# Patient Record
Sex: Male | Born: 1945
Health system: Southern US, Community
[De-identification: ages and names within clinical notes are randomized; demographics above are authoritative.]

## PROBLEM LIST (undated history)

## (undated) DIAGNOSIS — G4733 Obstructive sleep apnea (adult) (pediatric): Secondary | ICD-10-CM

## (undated) DIAGNOSIS — Z860101 Personal history of adenomatous and serrated colon polyps: Secondary | ICD-10-CM

## (undated) DIAGNOSIS — M5126 Other intervertebral disc displacement, lumbar region: Secondary | ICD-10-CM

## (undated) DIAGNOSIS — M1990 Unspecified osteoarthritis, unspecified site: Secondary | ICD-10-CM

## (undated) DIAGNOSIS — Z973 Presence of spectacles and contact lenses: Secondary | ICD-10-CM

## (undated) DIAGNOSIS — Z87448 Personal history of other diseases of urinary system: Secondary | ICD-10-CM

## (undated) DIAGNOSIS — M4302 Spondylolysis, cervical region: Secondary | ICD-10-CM

## (undated) DIAGNOSIS — K759 Inflammatory liver disease, unspecified: Secondary | ICD-10-CM

## (undated) DIAGNOSIS — K219 Gastro-esophageal reflux disease without esophagitis: Secondary | ICD-10-CM

## (undated) DIAGNOSIS — F4310 Post-traumatic stress disorder, unspecified: Secondary | ICD-10-CM

## (undated) DIAGNOSIS — E785 Hyperlipidemia, unspecified: Secondary | ICD-10-CM

## (undated) DIAGNOSIS — R05 Cough: Secondary | ICD-10-CM

## (undated) DIAGNOSIS — N183 Chronic kidney disease, stage 3 unspecified: Secondary | ICD-10-CM

## (undated) DIAGNOSIS — Z87898 Personal history of other specified conditions: Secondary | ICD-10-CM

## (undated) DIAGNOSIS — Z8619 Personal history of other infectious and parasitic diseases: Secondary | ICD-10-CM

## (undated) DIAGNOSIS — F419 Anxiety disorder, unspecified: Secondary | ICD-10-CM

## (undated) DIAGNOSIS — G4730 Sleep apnea, unspecified: Secondary | ICD-10-CM

## (undated) DIAGNOSIS — T83490A Other mechanical complication of penile (implanted) prosthesis, initial encounter: Secondary | ICD-10-CM

## (undated) DIAGNOSIS — N400 Enlarged prostate without lower urinary tract symptoms: Secondary | ICD-10-CM

## (undated) DIAGNOSIS — N529 Male erectile dysfunction, unspecified: Secondary | ICD-10-CM

## (undated) DIAGNOSIS — I10 Essential (primary) hypertension: Secondary | ICD-10-CM

## (undated) DIAGNOSIS — Z86010 Personal history of colonic polyps: Secondary | ICD-10-CM

## (undated) DIAGNOSIS — Z794 Long term (current) use of insulin: Secondary | ICD-10-CM

## (undated) DIAGNOSIS — G629 Polyneuropathy, unspecified: Secondary | ICD-10-CM

## (undated) DIAGNOSIS — R059 Cough, unspecified: Secondary | ICD-10-CM

## (undated) DIAGNOSIS — C801 Malignant (primary) neoplasm, unspecified: Secondary | ICD-10-CM

## (undated) DIAGNOSIS — J189 Pneumonia, unspecified organism: Secondary | ICD-10-CM

## (undated) DIAGNOSIS — T884XXA Failed or difficult intubation, initial encounter: Secondary | ICD-10-CM

## (undated) DIAGNOSIS — H269 Unspecified cataract: Secondary | ICD-10-CM

## (undated) DIAGNOSIS — E119 Type 2 diabetes mellitus without complications: Secondary | ICD-10-CM

## (undated) DIAGNOSIS — Z9989 Dependence on other enabling machines and devices: Secondary | ICD-10-CM

## (undated) HISTORY — DX: Personal history of other diseases of urinary system: Z87.448

## (undated) HISTORY — DX: Unspecified cataract: H26.9

## (undated) HISTORY — PX: HERNIA REPAIR: SHX51

## (undated) HISTORY — DX: Hyperlipidemia, unspecified: E78.5

## (undated) HISTORY — PX: COLONOSCOPY: SHX174

## (undated) HISTORY — PX: OTHER SURGICAL HISTORY: SHX169

---

## 1996-08-27 HISTORY — PX: PENILE PROSTHESIS PLACEMENT: SHX739

## 1998-01-31 ENCOUNTER — Ambulatory Visit (HOSPITAL_COMMUNITY): Admission: RE | Admit: 1998-01-31 | Discharge: 1998-01-31 | Payer: Self-pay | Admitting: Family Medicine

## 1998-04-25 ENCOUNTER — Ambulatory Visit (HOSPITAL_COMMUNITY): Admission: RE | Admit: 1998-04-25 | Discharge: 1998-04-25 | Payer: Self-pay | Admitting: Neurosurgery

## 1998-06-02 ENCOUNTER — Encounter: Payer: Self-pay | Admitting: Neurosurgery

## 1998-06-02 ENCOUNTER — Observation Stay (HOSPITAL_COMMUNITY): Admission: RE | Admit: 1998-06-02 | Discharge: 1998-06-02 | Payer: Self-pay | Admitting: Neurosurgery

## 1998-06-30 ENCOUNTER — Inpatient Hospital Stay (HOSPITAL_COMMUNITY): Admission: AD | Admit: 1998-06-30 | Discharge: 1998-07-04 | Payer: Self-pay | Admitting: Family Medicine

## 1998-07-19 ENCOUNTER — Encounter: Admission: RE | Admit: 1998-07-19 | Discharge: 1998-10-17 | Payer: Self-pay | Admitting: Family Medicine

## 2001-01-01 ENCOUNTER — Encounter: Admission: RE | Admit: 2001-01-01 | Discharge: 2001-04-01 | Payer: Self-pay | Admitting: Internal Medicine

## 2001-10-29 ENCOUNTER — Encounter: Admission: RE | Admit: 2001-10-29 | Discharge: 2001-10-29 | Payer: Self-pay

## 2002-04-01 ENCOUNTER — Encounter: Admission: RE | Admit: 2002-04-01 | Discharge: 2002-04-01 | Payer: Self-pay | Admitting: Internal Medicine

## 2002-04-08 ENCOUNTER — Encounter: Admission: RE | Admit: 2002-04-08 | Discharge: 2002-04-08 | Payer: Self-pay | Admitting: Internal Medicine

## 2002-04-14 ENCOUNTER — Encounter: Admission: RE | Admit: 2002-04-14 | Discharge: 2002-04-14 | Payer: Self-pay | Admitting: Internal Medicine

## 2003-12-07 ENCOUNTER — Emergency Department (HOSPITAL_COMMUNITY): Admission: EM | Admit: 2003-12-07 | Discharge: 2003-12-07 | Payer: Self-pay | Admitting: Advanced Practice Midwife

## 2004-03-27 HISTORY — PX: CARPAL TUNNEL RELEASE: SHX101

## 2013-08-27 DIAGNOSIS — J189 Pneumonia, unspecified organism: Secondary | ICD-10-CM

## 2013-08-27 DIAGNOSIS — T884XXA Failed or difficult intubation, initial encounter: Secondary | ICD-10-CM

## 2013-08-27 HISTORY — DX: Pneumonia, unspecified organism: J18.9

## 2013-08-27 HISTORY — DX: Failed or difficult intubation, initial encounter: T88.4XXA

## 2014-02-09 ENCOUNTER — Other Ambulatory Visit: Payer: Self-pay | Admitting: Neurosurgery

## 2014-02-23 NOTE — Pre-Procedure Instructions (Signed)
Randy [name]  02/23/2014   Your procedure is scheduled on:  July 9th, Thursday   Report to [facility] Admitting at  5:30 AM.   Call this number if you have problems the morning of surgery: [phone]   Remember:   Do not eat food or drink liquids after midnight Wednesday.    Take these medicines the morning of surgery with A SIP OF WATER:    Do not wear jewelry - no rings or watches.  Do not wear any lotions or colognes.  You may NOT wear deodorant.   Men may shave face and neck.   Do not bring valuables to the hospital.  [facility] is not responsible for any belongings or valuables.               Contacts, dentures or bridgework may not be worn into surgery.  Leave suitcase in the car. After surgery it may be brought to your room.  For patients admitted to the hospital, discharge time is determined by your treatment team.               Name and phone number of your driver:    Special Instructions: "Preparing for Surgery" instruction sheet.   Please read over the following fact sheets that you were given: Pain Booklet and Surgical Site Infection Prevention

## 2014-02-24 ENCOUNTER — Encounter (HOSPITAL_COMMUNITY)
Admission: RE | Admit: 2014-02-24 | Discharge: 2014-02-24 | Disposition: A | Discharge status: Home / Self Care | Payer: BC Managed Care – PPO | Source: Ambulatory Visit | Attending: Neurosurgery | Admitting: Neurosurgery

## 2014-02-24 ENCOUNTER — Encounter (HOSPITAL_COMMUNITY): Payer: Self-pay

## 2014-02-24 DIAGNOSIS — Z01818 Encounter for other preprocedural examination: Secondary | ICD-10-CM | POA: Insufficient documentation

## 2014-02-24 DIAGNOSIS — Z01810 Encounter for preprocedural cardiovascular examination: Secondary | ICD-10-CM | POA: Insufficient documentation

## 2014-02-24 DIAGNOSIS — Z01812 Encounter for preprocedural laboratory examination: Secondary | ICD-10-CM | POA: Insufficient documentation

## 2014-02-24 HISTORY — DX: Sleep apnea, unspecified: G47.30

## 2014-02-24 HISTORY — DX: Anxiety disorder, unspecified: F41.9

## 2014-02-24 HISTORY — DX: Inflammatory liver disease, unspecified: K75.9

## 2014-02-24 HISTORY — DX: Gastro-esophageal reflux disease without esophagitis: K21.9

## 2014-02-24 LAB — COMPREHENSIVE METABOLIC PANEL
ALT: 12 U/L (ref 0–53)
AST: 15 U/L (ref 0–37)
Albumin: 3.9 g/dL (ref 3.5–5.2)
Alkaline Phosphatase: 77 U/L (ref 39–117)
Anion gap: 15 (ref 5–15)
BUN: 21 mg/dL (ref 6–23)
CALCIUM: 9.6 mg/dL (ref 8.4–10.5)
CO2: 26 mEq/L (ref 19–32)
CREATININE: 2.17 mg/dL — AB (ref 0.50–1.35)
Chloride: 98 mEq/L (ref 96–112)
GFR, EST AFRICAN AMERICAN: 34 mL/min — AB (ref >90)
GFR, EST NON AFRICAN AMERICAN: 30 mL/min — AB (ref >90)
GLUCOSE: 228 mg/dL — AB (ref 70–99)
Potassium: 3.9 mEq/L (ref 3.7–5.3)
Sodium: 139 mEq/L (ref 137–147)
Total Bilirubin: 0.5 mg/dL (ref 0.3–1.2)
Total Protein: 7.7 g/dL (ref 6.0–8.3)

## 2014-02-24 LAB — SURGICAL PCR SCREEN
MRSA, PCR: NEGATIVE
Staphylococcus aureus: NEGATIVE

## 2014-02-24 LAB — CBC
HCT: 42 % (ref 39.0–52.0)
HEMOGLOBIN: 14.4 g/dL (ref 13.0–17.0)
MCH: 28.8 pg (ref 26.0–34.0)
MCHC: 34.3 g/dL (ref 30.0–36.0)
MCV: 84 fL (ref 78.0–100.0)
Platelets: 175 10*3/uL (ref 150–400)
RBC: 5 MIL/uL (ref 4.22–5.81)
RDW: 14.6 % (ref 11.5–15.5)
WBC: 12.1 10*3/uL — ABNORMAL HIGH (ref 4.0–10.5)

## 2014-02-24 NOTE — Progress Notes (Addendum)
Anesthesia Chart Review:  Patient is a 68 year old male scheduled for C3-4, C4-5 ACDF on 03/04/14 by Dr. Arnoldo Morale.  History includes smoking, GERD, DM2, anxiety/PTSD, OSA with CPAP use, hepatitis (unsure type, thinks it was "food borne") 68, c-spine surgery X 2, penile prothesis. He did not report a history of CKD, but Cr was elevated at 2.17 at PAT and previously was 1.7 at the [facility] on 01/06/14. BMI is consistent with obesity (31.8). PCP is Lucille Passy, PA-C with the [place] in [place]. BP was 83/58 at PAT. He reported that his SBP at his urologist earlier that day was in the 100 range. I was going to evaluate him during his PAT visit, but he left before I saw him.    Medications: Liquid tears, allopurinol, Lipitor, HCTZ, ibuprofen, Novolin 70/30, Novolin R, lisinopril, metoprolol, terazosin.  EKG on 02/24/14 showed NSR, LAD.  CXR on 02/24/14 showed: No active cardiopulmonary disease.   According to [facility] notes, MRI of the cspine on 02/08/14 showed: Bulging discs at C3-4 and C4-5 with moderate to severe central canal stenosis and cord compression at C3-4.  There is reported increased cord signal at C3 and C4, but this is not obvious on personal review in clinic 02/17/14.  Bilateral foraminal narrowing at all cervical levels from uncovertable spurs and facet arthropathy. Prior fusion at C7-T1.   Preoperative labs noted. Cr 2.17.  Non-fasting glucose 228.  AST/ALT WNL. WBC 12.1.  H/H 14.4/42.0. A1C was 7.3 at the [facility] on 01/06/14.   I am requesting his last PCP records from the [facility] and will follow-up once received.  George Hugh [facility]Anesthesiology Phone [phone] 02/25/2014  Addendum: 03/01/2014 2:36 PM Received additional records from [facility].  Cr trends are 1.4 - 1.7 since 03/07/06--predominantly in the 1.7 range, which was his last result there are 01/06/14.  They still did not send his most recent PCP note.  His BP at his last neurology visit on 02/17/14 was 106/69. I left  a voice message for patient to contact me earlier. He did call me back. He has been living in the [place] for the past 6 years, but returned on 01/05/14 for further evaluation since he began having BLE/BLL numbness 6-8 months ago. He is planning to move back once he recovers from surgery. Of note, he was seen last week at [facility] due to his penile prosthesis not working.  He may consider replacement as well following c-spine surgery recovery.  He says he has been on ACEI therapy for years now for his kidneys, but otherwise didn't know much specifics regarding CKD. He denies recent illness and is not currently taking any NSAIDS. He has been receiving most of his other care through the [facility].  He did have to see his son's PCP Dr. Monico Blitz in [place] just to get an out of [facility] referral to neurosurgery. (His second neck surgery was done at a [facility].  He had RUE difficultly following that surgery, so wanted to go back to [facility] in [place] for this surgery.) He checks his BP at home nearly every day, with average being 100-115/70's.  He denies any chest pain, SOB, syncope.  No known history of CAD/CHF/MI. His activity has been more limited for the past 6-8 months due to his rather diffuse paresthesias, but prior to that was doing quite a bit of walking.  He could walk at least a mile with no difficulty.  He denies having to use stairs.  I reviewed above with anesthesiologist Dr. Albertina Parr.  Based on previous labs, patient has a degree of underlying CKD but now with a further increase in his Cr since 12/2013. Other than ACEI therapy, it's unclear how (or who) his CKD is being managed.  If Dr. Arnoldo Morale feels surgery could safely be delayed, anesthesia would like for his PCP (or a nephrologist) to evaluate/make preoperative recommendations prior to surgery.  If Dr. Arnoldo Morale felt surgery was urgent and should not be delayed then I will plan to order an ISTAT8 on arrival to re-evaluate his Cr for  stability.  Based on results, Dr. Arnoldo Morale may need a medicine or nephrology consult post-operatively.  I have notified Manuela Schwartz at Dr. Adline Mango office.  She will discuss with Dr. Arnoldo Morale and let me know.   Addendum: 03/02/2014 9:26 AM Manuela Schwartz spoke with Dr. Arnoldo Morale about patient's renal function and anesthesia recommendations.  Due to patient's myelopathy, Dr. Arnoldo Morale feels procedure is urgent and thus feels it should proceed as planned.  He will consult nephrology post-operatively. ISTAT 8 ordered to ensure no significant increase in his Cr prior to surgery.

## 2014-02-24 NOTE — Progress Notes (Addendum)
Patient states his blood pressure swings from high to low --today's 83/58......Marland Kitchenhe stated he was just at Urologist and his bp was up in the 100/ range.  Have requested LOV from [place] in [place].  He sees Dr. Illene Silver the "white" team  (pt thinks) Also requested Sleep study results from [facility] (was in 2003, their fax [phone]) Uses CPAP.

## 2014-03-01 ENCOUNTER — Encounter (HOSPITAL_COMMUNITY): Payer: Self-pay

## 2014-03-03 MED ORDER — CEFAZOLIN SODIUM-DEXTROSE 2-3 GM-% IV SOLR
2.0000 g | INTRAVENOUS | Status: AC
  Administered 2014-03-04: 2 g via INTRAVENOUS
  Filled 2014-03-03: qty 50

## 2014-03-04 ENCOUNTER — Encounter (HOSPITAL_COMMUNITY)
Admission: RE | Disposition: A | Discharge status: Home / Self Care | Payer: Self-pay | Source: Ambulatory Visit | Attending: Neurosurgery

## 2014-03-04 ENCOUNTER — Ambulatory Visit (HOSPITAL_COMMUNITY)
Admission: RE | Admit: 2014-03-04 | Discharge: 2014-03-05 | Disposition: A | Discharge status: Home / Self Care | Payer: BC Managed Care – PPO | Source: Ambulatory Visit | Attending: Neurosurgery | Admitting: Neurosurgery

## 2014-03-04 ENCOUNTER — Encounter (HOSPITAL_COMMUNITY): Payer: BC Managed Care – PPO | Admitting: Vascular Surgery

## 2014-03-04 ENCOUNTER — Ambulatory Visit (HOSPITAL_COMMUNITY): Payer: BC Managed Care – PPO | Admitting: Certified Registered Nurse Anesthetist

## 2014-03-04 ENCOUNTER — Ambulatory Visit (HOSPITAL_COMMUNITY): Payer: BC Managed Care – PPO

## 2014-03-04 ENCOUNTER — Encounter (HOSPITAL_COMMUNITY): Payer: Self-pay | Admitting: *Deleted

## 2014-03-04 DIAGNOSIS — M5000 Cervical disc disorder with myelopathy, unspecified cervical region: Secondary | ICD-10-CM | POA: Insufficient documentation

## 2014-03-04 DIAGNOSIS — N400 Enlarged prostate without lower urinary tract symptoms: Secondary | ICD-10-CM | POA: Insufficient documentation

## 2014-03-04 DIAGNOSIS — Z794 Long term (current) use of insulin: Secondary | ICD-10-CM | POA: Insufficient documentation

## 2014-03-04 DIAGNOSIS — F411 Generalized anxiety disorder: Secondary | ICD-10-CM | POA: Insufficient documentation

## 2014-03-04 DIAGNOSIS — N183 Chronic kidney disease, stage 3 unspecified: Secondary | ICD-10-CM | POA: Insufficient documentation

## 2014-03-04 DIAGNOSIS — K219 Gastro-esophageal reflux disease without esophagitis: Secondary | ICD-10-CM | POA: Insufficient documentation

## 2014-03-04 DIAGNOSIS — N179 Acute kidney failure, unspecified: Secondary | ICD-10-CM | POA: Insufficient documentation

## 2014-03-04 DIAGNOSIS — E119 Type 2 diabetes mellitus without complications: Secondary | ICD-10-CM | POA: Insufficient documentation

## 2014-03-04 DIAGNOSIS — G4730 Sleep apnea, unspecified: Secondary | ICD-10-CM | POA: Insufficient documentation

## 2014-03-04 DIAGNOSIS — M4712 Other spondylosis with myelopathy, cervical region: Secondary | ICD-10-CM | POA: Insufficient documentation

## 2014-03-04 DIAGNOSIS — F4310 Post-traumatic stress disorder, unspecified: Secondary | ICD-10-CM | POA: Insufficient documentation

## 2014-03-04 DIAGNOSIS — I129 Hypertensive chronic kidney disease with stage 1 through stage 4 chronic kidney disease, or unspecified chronic kidney disease: Secondary | ICD-10-CM | POA: Insufficient documentation

## 2014-03-04 DIAGNOSIS — F17200 Nicotine dependence, unspecified, uncomplicated: Secondary | ICD-10-CM | POA: Insufficient documentation

## 2014-03-04 HISTORY — PX: ANTERIOR CERVICAL DECOMP/DISCECTOMY FUSION: SHX1161

## 2014-03-04 LAB — POCT I-STAT, CHEM 8
BUN: 17 mg/dL (ref 6–23)
Calcium, Ion: 1.33 mmol/L — ABNORMAL HIGH (ref 1.13–1.30)
Chloride: 100 mEq/L (ref 96–112)
Creatinine, Ser: 2 mg/dL — ABNORMAL HIGH (ref 0.50–1.35)
Glucose, Bld: 67 mg/dL — ABNORMAL LOW (ref 70–99)
HEMATOCRIT: 46 % (ref 39.0–52.0)
Hemoglobin: 15.6 g/dL (ref 13.0–17.0)
POTASSIUM: 3.6 meq/L — AB (ref 3.7–5.3)
Sodium: 144 mEq/L (ref 137–147)
TCO2: 25 mmol/L (ref 0–100)

## 2014-03-04 LAB — GLUCOSE, CAPILLARY
GLUCOSE-CAPILLARY: 285 mg/dL — AB (ref 70–99)
Glucose-Capillary: 144 mg/dL — ABNORMAL HIGH (ref 70–99)
Glucose-Capillary: 93 mg/dL (ref 70–99)
Glucose-Capillary: 105 mg/dL — ABNORMAL HIGH (ref 70–99)
Glucose-Capillary: 119 mg/dL — ABNORMAL HIGH (ref 70–99)
Glucose-Capillary: 212 mg/dL — ABNORMAL HIGH (ref 70–99)
Glucose-Capillary: 220 mg/dL — ABNORMAL HIGH (ref 70–99)

## 2014-03-04 SURGERY — ANTERIOR CERVICAL DECOMPRESSION/DISCECTOMY FUSION 2 LEVELS
Anesthesia: General | Site: Neck

## 2014-03-04 MED ORDER — ALLOPURINOL 100 MG PO TABS
100.0000 mg | ORAL_TABLET | Freq: Every evening | ORAL | Status: DC
  Administered 2014-03-04: 100 mg via ORAL
  Filled 2014-03-04 (×2): qty 1

## 2014-03-04 MED ORDER — ONDANSETRON HCL 4 MG/2ML IJ SOLN
INTRAMUSCULAR | Status: AC
  Filled 2014-03-04: qty 2

## 2014-03-04 MED ORDER — DEXAMETHASONE SODIUM PHOSPHATE 10 MG/ML IJ SOLN
INTRAMUSCULAR | Status: DC | PRN
  Administered 2014-03-04: 10 mg via INTRAVENOUS

## 2014-03-04 MED ORDER — PROPOFOL 10 MG/ML IV BOLUS
INTRAVENOUS | Status: AC
  Filled 2014-03-04: qty 20

## 2014-03-04 MED ORDER — FENTANYL CITRATE 0.05 MG/ML IJ SOLN
INTRAMUSCULAR | Status: AC
  Filled 2014-03-04: qty 5

## 2014-03-04 MED ORDER — ARTIFICIAL TEARS OP OINT
TOPICAL_OINTMENT | OPHTHALMIC | Status: DC | PRN
  Administered 2014-03-04: 1 via OPHTHALMIC

## 2014-03-04 MED ORDER — FENTANYL CITRATE 0.05 MG/ML IJ SOLN
INTRAMUSCULAR | Status: DC | PRN
  Administered 2014-03-04: 50 ug via INTRAVENOUS
  Administered 2014-03-04: 100 ug via INTRAVENOUS
  Administered 2014-03-04 (×2): 50 ug via INTRAVENOUS

## 2014-03-04 MED ORDER — BUPIVACAINE-EPINEPHRINE (PF) 0.5% -1:200000 IJ SOLN
INTRAMUSCULAR | Status: DC | PRN
  Administered 2014-03-04: 10 mL via PERINEURAL

## 2014-03-04 MED ORDER — DOCUSATE SODIUM 100 MG PO CAPS
100.0000 mg | ORAL_CAPSULE | Freq: Two times a day (BID) | ORAL | Status: DC
  Administered 2014-03-04: 100 mg via ORAL
  Filled 2014-03-04: qty 1

## 2014-03-04 MED ORDER — PROPOFOL 10 MG/ML IV EMUL
INTRAVENOUS | Status: AC
  Filled 2014-03-04: qty 50

## 2014-03-04 MED ORDER — MIDAZOLAM HCL 2 MG/2ML IJ SOLN
INTRAMUSCULAR | Status: AC
  Filled 2014-03-04: qty 2

## 2014-03-04 MED ORDER — DIAZEPAM 5 MG PO TABS: 5.0000 mg | ORAL_TABLET | Freq: Four times a day (QID) | ORAL | Status: DC | PRN

## 2014-03-04 MED ORDER — LISINOPRIL 40 MG PO TABS
40.0000 mg | ORAL_TABLET | Freq: Every day | ORAL | Status: DC
  Filled 2014-03-04: qty 1

## 2014-03-04 MED ORDER — HYDROCODONE-ACETAMINOPHEN 5-325 MG PO TABS
1.0000 | ORAL_TABLET | ORAL | Status: DC | PRN
  Administered 2014-03-04 – 2014-03-05 (×3): 2 via ORAL
  Filled 2014-03-04 (×3): qty 2

## 2014-03-04 MED ORDER — THROMBIN 5000 UNITS EX SOLR
CUTANEOUS | Status: DC | PRN
  Administered 2014-03-04 (×2): 5000 [IU] via TOPICAL

## 2014-03-04 MED ORDER — ACETAMINOPHEN 650 MG RE SUPP: 650.0000 mg | RECTAL | Status: DC | PRN

## 2014-03-04 MED ORDER — ALBUTEROL SULFATE (2.5 MG/3ML) 0.083% IN NEBU
INHALATION_SOLUTION | RESPIRATORY_TRACT | Status: AC
  Filled 2014-03-04: qty 3

## 2014-03-04 MED ORDER — LACTATED RINGERS IV SOLN
INTRAVENOUS | Status: DC | PRN
  Administered 2014-03-04 (×3): via INTRAVENOUS

## 2014-03-04 MED ORDER — PHENOL 1.4 % MT LIQD: 1.0000 | OROMUCOSAL | Status: DC | PRN

## 2014-03-04 MED ORDER — PROPOFOL 10 MG/ML IV BOLUS
INTRAVENOUS | Status: DC | PRN
  Administered 2014-03-04: 50 mg via INTRAVENOUS
  Administered 2014-03-04: 150 mg via INTRAVENOUS
  Administered 2014-03-04: 50 mg via INTRAVENOUS

## 2014-03-04 MED ORDER — 0.9 % SODIUM CHLORIDE (POUR BTL) OPTIME
TOPICAL | Status: DC | PRN
  Administered 2014-03-04: 1000 mL

## 2014-03-04 MED ORDER — TERAZOSIN HCL 5 MG PO CAPS
5.0000 mg | ORAL_CAPSULE | Freq: Every day | ORAL | Status: DC
  Administered 2014-03-04: 5 mg via ORAL
  Filled 2014-03-04 (×2): qty 1

## 2014-03-04 MED ORDER — NEOSTIGMINE METHYLSULFATE 10 MG/10ML IV SOLN
INTRAVENOUS | Status: DC | PRN
  Administered 2014-03-04: 4 mg via INTRAVENOUS
  Administered 2014-03-04: 1 mg via INTRAVENOUS

## 2014-03-04 MED ORDER — ACETAMINOPHEN 325 MG PO TABS: 650.0000 mg | ORAL_TABLET | ORAL | Status: DC | PRN

## 2014-03-04 MED ORDER — GLYCOPYRROLATE 0.2 MG/ML IJ SOLN
INTRAMUSCULAR | Status: DC | PRN
  Administered 2014-03-04: 0.6 mg via INTRAVENOUS
  Administered 2014-03-04: 0.2 mg via INTRAVENOUS

## 2014-03-04 MED ORDER — SUCCINYLCHOLINE CHLORIDE 20 MG/ML IJ SOLN
INTRAMUSCULAR | Status: DC | PRN
  Administered 2014-03-04: 120 mg via INTRAVENOUS

## 2014-03-04 MED ORDER — METOPROLOL TARTRATE 50 MG PO TABS
50.0000 mg | ORAL_TABLET | Freq: Two times a day (BID) | ORAL | Status: DC
  Administered 2014-03-04: 50 mg via ORAL
  Filled 2014-03-04 (×3): qty 1

## 2014-03-04 MED ORDER — ARTIFICIAL TEARS OP OINT
TOPICAL_OINTMENT | OPHTHALMIC | Status: AC
  Filled 2014-03-04: qty 3.5

## 2014-03-04 MED ORDER — LACTATED RINGERS IV SOLN
INTRAVENOUS | Status: DC
  Administered 2014-03-04 (×2): via INTRAVENOUS

## 2014-03-04 MED ORDER — ATORVASTATIN CALCIUM 20 MG PO TABS
20.0000 mg | ORAL_TABLET | Freq: Every day | ORAL | Status: DC
  Administered 2014-03-04: 20 mg via ORAL
  Filled 2014-03-04 (×2): qty 1

## 2014-03-04 MED ORDER — LIDOCAINE HCL (CARDIAC) 20 MG/ML IV SOLN
INTRAVENOUS | Status: DC | PRN
  Administered 2014-03-04: 50 mg via INTRAVENOUS

## 2014-03-04 MED ORDER — SODIUM CHLORIDE 0.9 % IR SOLN
Status: DC | PRN
  Administered 2014-03-04: 09:00:00

## 2014-03-04 MED ORDER — ALUM & MAG HYDROXIDE-SIMETH 200-200-20 MG/5ML PO SUSP: 30.0000 mL | Freq: Four times a day (QID) | ORAL | Status: DC | PRN

## 2014-03-04 MED ORDER — PHENYLEPHRINE HCL 10 MG/ML IJ SOLN
INTRAMUSCULAR | Status: DC | PRN
  Administered 2014-03-04 (×5): 80 ug via INTRAVENOUS

## 2014-03-04 MED ORDER — HEMOSTATIC AGENTS (NO CHARGE) OPTIME
TOPICAL | Status: DC | PRN
  Administered 2014-03-04: 1 via TOPICAL

## 2014-03-04 MED ORDER — LACTATED RINGERS IV SOLN: INTRAVENOUS | Status: DC

## 2014-03-04 MED ORDER — OXYCODONE-ACETAMINOPHEN 5-325 MG PO TABS: 1.0000 | ORAL_TABLET | ORAL | Status: DC | PRN

## 2014-03-04 MED ORDER — SUCCINYLCHOLINE CHLORIDE 20 MG/ML IJ SOLN
INTRAMUSCULAR | Status: AC
  Filled 2014-03-04: qty 1

## 2014-03-04 MED ORDER — HYDROMORPHONE HCL PF 1 MG/ML IJ SOLN
INTRAMUSCULAR | Status: AC
  Filled 2014-03-04: qty 1

## 2014-03-04 MED ORDER — MORPHINE SULFATE 2 MG/ML IJ SOLN
1.0000 mg | INTRAMUSCULAR | Status: DC | PRN
  Administered 2014-03-04 (×2): 4 mg via INTRAVENOUS
  Administered 2014-03-05: 2 mg via INTRAVENOUS
  Filled 2014-03-04 (×3): qty 2

## 2014-03-04 MED ORDER — CEFAZOLIN SODIUM-DEXTROSE 2-3 GM-% IV SOLR
2.0000 g | Freq: Three times a day (TID) | INTRAVENOUS | Status: AC
  Administered 2014-03-04 (×2): 2 g via INTRAVENOUS
  Filled 2014-03-04 (×2): qty 50

## 2014-03-04 MED ORDER — NEOSTIGMINE METHYLSULFATE 10 MG/10ML IV SOLN
INTRAVENOUS | Status: AC
  Filled 2014-03-04: qty 1

## 2014-03-04 MED ORDER — MIDAZOLAM HCL 5 MG/5ML IJ SOLN
INTRAMUSCULAR | Status: DC | PRN
  Administered 2014-03-04: 2 mg via INTRAVENOUS

## 2014-03-04 MED ORDER — ROCURONIUM BROMIDE 50 MG/5ML IV SOLN
INTRAVENOUS | Status: AC
  Filled 2014-03-04: qty 1

## 2014-03-04 MED ORDER — HYDROMORPHONE HCL PF 1 MG/ML IJ SOLN
0.2500 mg | INTRAMUSCULAR | Status: DC | PRN
  Administered 2014-03-04 (×2): 0.5 mg via INTRAVENOUS

## 2014-03-04 MED ORDER — ALBUTEROL SULFATE (2.5 MG/3ML) 0.083% IN NEBU
2.5000 mg | INHALATION_SOLUTION | Freq: Once | RESPIRATORY_TRACT | Status: AC
  Administered 2014-03-04: 2.5 mg via RESPIRATORY_TRACT

## 2014-03-04 MED ORDER — ONDANSETRON HCL 4 MG/2ML IJ SOLN: 4.0000 mg | INTRAMUSCULAR | Status: DC | PRN

## 2014-03-04 MED ORDER — LIDOCAINE HCL (CARDIAC) 20 MG/ML IV SOLN
INTRAVENOUS | Status: AC
  Filled 2014-03-04: qty 5

## 2014-03-04 MED ORDER — PHENYLEPHRINE HCL 10 MG/ML IJ SOLN
INTRAMUSCULAR | Status: AC
  Filled 2014-03-04: qty 1

## 2014-03-04 MED ORDER — DEXTROSE 50 % IV SOLN
INTRAVENOUS | Status: AC
  Administered 2014-03-04: 25 mL via INTRAVENOUS
  Filled 2014-03-04: qty 50

## 2014-03-04 MED ORDER — INSULIN ASPART 100 UNIT/ML ~~LOC~~ SOLN
0.0000 [IU] | SUBCUTANEOUS | Status: DC
Start: 2014-03-04 — End: 2014-03-05
  Administered 2014-03-04 (×2): 7 [IU] via SUBCUTANEOUS
  Administered 2014-03-04: 11 [IU] via SUBCUTANEOUS
  Administered 2014-03-05: 4 [IU] via SUBCUTANEOUS
  Administered 2014-03-05: 7 [IU] via SUBCUTANEOUS

## 2014-03-04 MED ORDER — DEXTROSE 50 % IV SOLN
25.0000 mL | Freq: Once | INTRAVENOUS | Status: AC
  Administered 2014-03-04: 25 mL via INTRAVENOUS
  Filled 2014-03-04: qty 50

## 2014-03-04 MED ORDER — MENTHOL 3 MG MT LOZG
1.0000 | LOZENGE | OROMUCOSAL | Status: DC | PRN
  Filled 2014-03-04: qty 9

## 2014-03-04 MED ORDER — SODIUM CHLORIDE 0.9 % IV SOLN
10.0000 mg | INTRAVENOUS | Status: DC | PRN
  Administered 2014-03-04: 25 ug/min via INTRAVENOUS

## 2014-03-04 MED ORDER — ROCURONIUM BROMIDE 100 MG/10ML IV SOLN
INTRAVENOUS | Status: DC | PRN
  Administered 2014-03-04: 10 mg via INTRAVENOUS
  Administered 2014-03-04: 40 mg via INTRAVENOUS

## 2014-03-04 MED ORDER — PHENYLEPHRINE 40 MCG/ML (10ML) SYRINGE FOR IV PUSH (FOR BLOOD PRESSURE SUPPORT)
PREFILLED_SYRINGE | INTRAVENOUS | Status: AC
  Filled 2014-03-04: qty 10

## 2014-03-04 MED ORDER — ALBUTEROL SULFATE HFA 108 (90 BASE) MCG/ACT IN AERS
INHALATION_SPRAY | RESPIRATORY_TRACT | Status: DC | PRN
  Administered 2014-03-04 (×4): 2 via RESPIRATORY_TRACT

## 2014-03-04 MED ORDER — DEXAMETHASONE SODIUM PHOSPHATE 10 MG/ML IJ SOLN
INTRAMUSCULAR | Status: AC
  Filled 2014-03-04: qty 1

## 2014-03-04 MED ORDER — ONDANSETRON HCL 4 MG/2ML IJ SOLN
INTRAMUSCULAR | Status: DC | PRN
  Administered 2014-03-04: 4 mg via INTRAVENOUS

## 2014-03-04 MED ORDER — GLYCOPYRROLATE 0.2 MG/ML IJ SOLN
INTRAMUSCULAR | Status: AC
  Filled 2014-03-04: qty 3

## 2014-03-04 MED ORDER — HYDROCHLOROTHIAZIDE 25 MG PO TABS
25.0000 mg | ORAL_TABLET | Freq: Every day | ORAL | Status: DC
  Filled 2014-03-04: qty 1

## 2014-03-04 SURGICAL SUPPLY — 64 items
APL SKNCLS STERI-STRIP NONHPOA (GAUZE/BANDAGES/DRESSINGS) ×1
BAG DECANTER FOR FLEXI CONT (MISCELLANEOUS) ×2 IMPLANT
BENZOIN TINCTURE PRP APPL 2/3 (GAUZE/BANDAGES/DRESSINGS) ×3 IMPLANT
BIT DRILL NEURO 2X3.1 SFT TUCH (MISCELLANEOUS) ×1 IMPLANT
BLADE 10 SAFETY STRL DISP (BLADE) ×1 IMPLANT
BLADE SURG 15 STRL LF DISP TIS (BLADE) ×1 IMPLANT
BLADE SURG 15 STRL SS (BLADE) ×2
BLADE ULTRA TIP 2M (BLADE) ×2 IMPLANT
BRUSH SCRUB EZ PLAIN DRY (MISCELLANEOUS) ×2 IMPLANT
BUR BARREL STRAIGHT FLUTE 4.0 (BURR) ×2 IMPLANT
BUR MATCHSTICK NEURO 3.0 LAGG (BURR) ×2 IMPLANT
CANISTER SUCT 3000ML (MISCELLANEOUS) ×2 IMPLANT
CONT SPEC 4OZ CLIKSEAL STRL BL (MISCELLANEOUS) ×2 IMPLANT
COVER MAYO STAND STRL (DRAPES) ×2 IMPLANT
DRAPE LAPAROTOMY 100X72 PEDS (DRAPES) ×2 IMPLANT
DRAPE MICROSCOPE LEICA (MISCELLANEOUS) IMPLANT
DRAPE POUCH INSTRU U-SHP 10X18 (DRAPES) ×2 IMPLANT
DRAPE SURG 17X23 STRL (DRAPES) ×4 IMPLANT
DRILL NEURO 2X3.1 SOFT TOUCH (MISCELLANEOUS) ×2
ELECT REM PT RETURN 9FT ADLT (ELECTROSURGICAL) ×2
ELECTRODE REM PT RTRN 9FT ADLT (ELECTROSURGICAL) ×1 IMPLANT
GAUZE SPONGE 4X4 16PLY XRAY LF (GAUZE/BANDAGES/DRESSINGS) IMPLANT
GLOVE BIO SURGEON STRL SZ8 (GLOVE) ×1 IMPLANT
GLOVE BIO SURGEON STRL SZ8.5 (GLOVE) ×2 IMPLANT
GLOVE BIOGEL PI IND STRL 7.5 (GLOVE) IMPLANT
GLOVE BIOGEL PI INDICATOR 7.5 (GLOVE) ×1
GLOVE EXAM NITRILE LRG STRL (GLOVE) IMPLANT
GLOVE EXAM NITRILE MD LF STRL (GLOVE) IMPLANT
GLOVE EXAM NITRILE XL STR (GLOVE) IMPLANT
GLOVE EXAM NITRILE XS STR PU (GLOVE) IMPLANT
GLOVE SS BIOGEL STRL SZ 8 (GLOVE) ×1 IMPLANT
GLOVE SS N UNI LF 7.5 STRL (GLOVE) ×3 IMPLANT
GLOVE SUPERSENSE BIOGEL SZ 8 (GLOVE) ×1
GOWN STRL REUS W/ TWL LRG LVL3 (GOWN DISPOSABLE) IMPLANT
GOWN STRL REUS W/ TWL XL LVL3 (GOWN DISPOSABLE) IMPLANT
GOWN STRL REUS W/TWL LRG LVL3 (GOWN DISPOSABLE) ×2
GOWN STRL REUS W/TWL XL LVL3 (GOWN DISPOSABLE) ×4
KIT BASIN OR (CUSTOM PROCEDURE TRAY) ×2 IMPLANT
KIT ROOM TURNOVER OR (KITS) ×2 IMPLANT
MARKER SKIN DUAL TIP RULER LAB (MISCELLANEOUS) ×2 IMPLANT
NDL SPNL 18GX3.5 QUINCKE PK (NEEDLE) ×1 IMPLANT
NEEDLE HYPO 22GX1.5 SAFETY (NEEDLE) ×2 IMPLANT
NEEDLE SPNL 18GX3.5 QUINCKE PK (NEEDLE) ×2 IMPLANT
NS IRRIG 1000ML POUR BTL (IV SOLUTION) ×2 IMPLANT
PACK LAMINECTOMY NEURO (CUSTOM PROCEDURE TRAY) ×2 IMPLANT
PEEK VISTA 14X14X7MM (Peek) ×2 IMPLANT
PIN DISTRACTION 14MM (PIN) ×4 IMPLANT
PLATE ANT CERV XTEND 2 LV 30 (Plate) ×1 IMPLANT
PUTTY 5ML ACTIFUSE ABX (Putty) ×1 IMPLANT
RUBBERBAND STERILE (MISCELLANEOUS) IMPLANT
SCREW XTD VAR 4.2 SELF TAP (Screw) ×6 IMPLANT
SPONGE GAUZE 4X4 12PLY (GAUZE/BANDAGES/DRESSINGS) ×2 IMPLANT
SPONGE INTESTINAL PEANUT (DISPOSABLE) ×4 IMPLANT
SPONGE SURGIFOAM ABS GEL SZ50 (HEMOSTASIS) ×2 IMPLANT
STRIP CLOSURE SKIN 1/2X4 (GAUZE/BANDAGES/DRESSINGS) ×2 IMPLANT
SUT VIC AB 0 CT1 27 (SUTURE) ×2
SUT VIC AB 0 CT1 27XBRD ANTBC (SUTURE) ×1 IMPLANT
SUT VIC AB 3-0 SH 8-18 (SUTURE) ×2 IMPLANT
SYR 20ML ECCENTRIC (SYRINGE) ×2 IMPLANT
TAPE CLOTH SURG 4X10 WHT LF (GAUZE/BANDAGES/DRESSINGS) ×1 IMPLANT
TAPE STRIPS DRAPE STRL (GAUZE/BANDAGES/DRESSINGS) ×1 IMPLANT
TOWEL OR 17X24 6PK STRL BLUE (TOWEL DISPOSABLE) ×2 IMPLANT
TOWEL OR 17X26 10 PK STRL BLUE (TOWEL DISPOSABLE) ×2 IMPLANT
WATER STERILE IRR 1000ML POUR (IV SOLUTION) ×2 IMPLANT

## 2014-03-04 NOTE — Op Note (Signed)
Brief history: The patient is a 68 year old black male who has complained of neck and arm pain and numbness with gait difficulties. He was worked up with a cervical MRI which demonstrated severe stenosis at C3-4 and C4-5. I discussed the various treatment option with the patient including surgery. He has weighed the risks, benefits, and alternatives surgery and decided proceed with a C3-4 and C4-5 anterior cervical discectomy, fusion, and plating.  Preoperative diagnosis: C3-4 and C4-5 disc degeneration, spondylosis, stenosis, cervicalgia, cervical radiculopathy, cervical myelopathy  Postoperative diagnosis: The same  Procedure: C3-4 and C4-5 Anterior cervical discectomy/decompression; C3-4 and C4-5 interbody arthrodesis with local morcellized autograft bone and Actifuse bone graft extender; insertion of interbody prosthesis at C3-4 and C4-5 (Zimmer peek interbody prosthesis); anterior cervical plating from C3-C5 with globus titanium plate  Surgeon: Dr. Earle Gell  Asst.: Dr. Sherley Bounds  Anesthesia: Gen. endotracheal  Estimated blood loss: 125 cc  Drains: None  Complications: None  Description of procedure: The patient was brought to the operating room by the anesthesia team. General endotracheal anesthesia was induced. A roll was placed under the patient's shoulders to keep the neck in the neutral position. The patient's anterior cervical region was then prepared with Betadine scrub and Betadine solution. Sterile drapes were applied.  The area to be incised was then injected with Marcaine with epinephrine solution. I then used a scalpel to make a transverse incision in the patient's left anterior neck. I used the Metzenbaum scissors to divide the platysmal muscle and then to dissect medial to the sternocleidomastoid muscle, jugular vein, and carotid artery. I carefully dissected down towards the anterior cervical spine identifying the esophagus and retracting it medially. Then using Kitner  swabs to clear soft tissue from the anterior cervical spine. We then inserted a bent spinal needle into the upper exposed intervertebral disc space. We then obtained intraoperative radiographs confirm our location.  I then used electrocautery to detach the medial border of the longus colli muscle bilaterally from the C3-4 and C4-5 intervertebral disc spaces. I then inserted the Caspar self-retaining retractor underneath the longus colli muscle bilaterally to provide exposure.  We then incised the intervertebral disc at C3-4. We then performed a partial intervertebral discectomy with a pituitary forceps and the Karlin curettes. I then inserted distraction screws into the vertebral bodies at C3-4. We then distracted the interspace. We then used the high-speed drill to decorticate the vertebral endplates at P3-2, to drill away the remainder of the intervertebral disc, to drill away some posterior spondylosis, and to thin out the posterior longitudinal ligament. I then incised ligament with the arachnoid knife. We then removed the ligament with a Kerrison punches undercutting the vertebral endplates and decompressing the thecal sac. We then performed foraminotomies about the bilateral C4 nerve roots. This completed the decompression at this level.  We then repeated this procedure in an analogous fashion at C4-5 decompressing the thecal sac and the bilateral C5 nerve roots.  We now turned our to attention to the interbody fusion. We used the trial spacers to determine the appropriate size for the interbody prosthesis. We then pre-filled prosthesis with a combination of local morcellized autograft bone that we obtained during decompression as well as Actifuse bone graft extender. We then inserted the prosthesis into the distracted interspace at C3-4 and C4-5. We then removed the distraction screws. There was a good snug fit of the prosthesis in the interspace.  Having completed the fusion we now turned attention  to the anterior spinal instrumentation. We  used the high-speed drill to drill away some anterior spondylosis at the disc spaces so that the plate lay down flat. We selected the appropriate length titanium anterior cervical plate. We laid it along the anterior aspect of the vertebral bodies from C3-C5. We then drilled 14 mm holes at C3, C4 and C5. We then secured the plate to the vertebral bodies by placing two 14 mm self-tapping screws at C3, C4 and C5. We then obtained intraoperative radiograph. The demonstrating good position of the instrumentation. We therefore secured the screws the plate the locking each cam. This completed the instrumentation.  We then obtained hemostasis using bipolar electrocautery. We irrigated the wound out with bacitracin solution. We then removed the retractor. We inspected the esophagus for any damage. There was none apparent. We then reapproximated patient's platysmal muscle with interrupted 3-0 Vicryl suture. We then reapproximated the subcutaneous tissue with interrupted 3-0 Vicryl suture. The skin was reapproximated with Steri-Strips and benzoin. The wound was then covered with bacitracin ointment. A sterile dressing was applied. The drapes were removed. Patient was subsequently extubated by the anesthesia team and transported to the post anesthesia care unit in stable condition. All sponge instrument and needle counts were reportedly correct at the end of this case.

## 2014-03-04 NOTE — Addendum Note (Signed)
Addendum created 03/04/14 1337 by Verdie Drown, CRNA   Modules edited: Charges VN

## 2014-03-04 NOTE — Progress Notes (Signed)
Patient ID: Randy [name], male   DOB: 1946/02/11, 68 y.o.   MRN: [id number] Subjective:  The patient is alert and pleasant. He is in no apparent distress.  Objective: Vital signs in last 24 hours: Temp:  [97.2 F (36.2 C)-98.4 F (36.9 C)] 98.4 F (36.9 C) (07/09 1122) Pulse Rate:  [84-115] 102 (07/09 1300) Resp:  [18-35] 19 (07/09 1300) BP: (106-163)/(56-95) 154/95 mmHg (07/09 1226) SpO2:  [94 %-99 %] 96 % (07/09 1300) Weight:  [94.938 kg (209 lb 4.8 oz)] 94.938 kg (209 lb 4.8 oz) (07/09 0607)  Intake/Output from previous day:   Intake/Output this shift: Total I/O In: 2000 [I.V.:2000] Out: 340 [Urine:140; Blood:200]  Physical exam the patient is alert and pleasant. He is moving all 4 extremities well. His dressing is clean and dry. There is no evidence of hematoma or shift.  Lab Results:  Recent Labs  03/04/14 0559  HGB 15.6  HCT 46.0   BMET  Recent Labs  03/04/14 0559  NA 144  K 3.6*  CL 100  GLUCOSE 67*  BUN 17  CREATININE 2.00*    Studies/Results: Dg Cervical Spine 2-3 Views  03/04/2014   CLINICAL DATA:  Anterior cervical decompression, discectomy and fusion at C3-C4 and C4-C5  EXAM: CERVICAL SPINE - 2-3 VIEW  COMPARISON:  Two cross-table lateral views obtained intraoperatively are correlated without priors for comparison.  FINDINGS: Image #1 at 0800 hrs: Anterior metallic probe projects at the anterior aspect of the C3-C4 disc space. Disc space narrowing C3-C4 with minimal endplate spur formation.  Image #2 at 1041 hrs: Anterior plate and screws identified at C3-C5. Disc prostheses at intervening C3-C4 and C4-C5 disc spaces. No gross fracture or subluxation seen on assessment limited by superimposition of the shoulders. Surgical sponges project anterior to the surgical plate.  IMPRESSION: Intraoperative images obtained during anterior cervical spine fusion as above.   Electronically Signed   By: Lavonia Dana M.D.   On: 03/04/2014 11:35    Assessment/Plan: The  patient is doing well. I spoke with his family. We'll observe him in the ICU.  LOS: 0 days     Tal Kempker D 03/04/2014, 1:22 PM

## 2014-03-04 NOTE — Consult Note (Signed)
Reason for Consult: Chronic Kidney disease  Referring Physician: Dr. Newman Pies  Randy [name] is an 68 y.o. male with PMH of DM2, HTN, GERD, Sleep apnea, Cervical stenosis s/p multiple surgeries, Anxiety/PTSD, penile prostesis. Pt was admitted today for C3-4 and C4-5 anterior cervical discectomy, fusion, and plating for C3-4 and C4-5 disc degeneration, spondylosis, stenosis, cervicalgia, cervical radiculopathy, cervical myelopathy. Routine labs done- 02/24/2014- Revealed a Cr- 2.17, repeat- today Cr- 2.00. Last Cr as per records form Pts PCP 1.4-1.7 since 2007, and was 1.7 at last visit- 01/06/2014. Pt says at his last visit- which was May 13th, his BP meds were adjusted, Lisinopril was increased from 20 to 40mg , metop was also increased. Pt says he has a prescription for 800mg  of ibuprofen TID, but the last time he took it was at least 6 month ago, denies use of any NSAIDS. Pt says he has had an MRI for his neck this year, but denies use of NSAIDS.  Pt also say he has had prostate issues for years, was diagnosed with BPH, for which he was placed on terazosin. Sometimes has incontinence and sometimes has to strain to pass urine, no dysuria. Pt says he follows with [facility] for this.  Pt also has a hx of diabetes, but says his last hgbA1c- ~7.  No hx of diarrhea, vomiting or poor Po intake. Denies use of any herbs or other over the counter medications. Has some leg swelling, no SOB, or nausea or bone pains- except for chronic neck pain..   Trend in Creatinine: Creatinine, Ser  Date/Time Value Ref Range Status  03/04/2014  5:59 AM 2.00* 0.50 - 1.35 mg/dL Final  02/24/2014  2:18 PM 2.17* 0.50 - 1.35 mg/dL Final    PMH:   Past Medical History  Diagnosis Date  . Sleep apnea   . GERD (gastroesophageal reflux disease)   . Diabetes mellitus without complication   . Anxiety     HAS  PTSD  . Hepatitis     1966   Hep ???    PSH:   Past Surgical History  Procedure Laterality Date  .  Cervical spine  x  2    . Penile prosthesis placement      not working as of 02/2014--being followed at [facility]    Allergies: No Known Allergies  Medications:   Prior to Admission medications   Medication Sig Start Date End Date Taking? Authorizing Provider  allopurinol (ZYLOPRIM) 100 MG tablet Take 100 mg by mouth every evening.   Yes Historical Provider, MD  atorvastatin (LIPITOR) 40 MG tablet Take 20 mg by mouth at bedtime.   Yes Historical Provider, MD  hydrochlorothiazide (HYDRODIURIL) 25 MG tablet Take 25 mg by mouth every morning.   Yes Historical Provider, MD  insulin NPH-regular Human (NOVOLIN 70/30) (70-30) 100 UNIT/ML injection Inject 45-55 Units into the skin 2 (two) times daily. 45 units in the morning and 50 units at night   Yes Historical Provider, MD  insulin regular (NOVOLIN R) 100 units/mL injection Inject 12 Units into the skin 3 (three) times daily as needed for high blood sugar (over 250).   Yes Historical Provider, MD  lisinopril (PRINIVIL,ZESTRIL) 40 MG tablet Take 40 mg by mouth every morning.   Yes Historical Provider, MD  metoprolol (LOPRESSOR) 50 MG tablet Take 50 mg by mouth 2 (two) times daily.   Yes Historical Provider, MD  Polyvinyl Alcohol (LIQUID TEARS OP) Place 1 drop into both eyes daily as needed (dry eyes).  Yes Historical Provider, MD  terazosin (HYTRIN) 5 MG capsule Take 5 mg by mouth at bedtime.   Yes Historical Provider, MD  ibuprofen (ADVIL,MOTRIN) 800 MG tablet Take 800 mg by mouth every 8 (eight) hours as needed for moderate pain.    Historical Provider, MD    Inpatient medications: . albuterol      . albuterol      . HYDROmorphone      . propofol        Discontinued Meds:   Medications Discontinued During This Encounter  Medication Reason  . thrombin spray Patient Discharge  . hemostatic agents Patient Discharge  . bupivacaine-epinephrine (MARCAINE W/ EPI) 0.5% -1:200000 injection Patient Discharge  . bacitracin 50,000 Units in  sodium chloride irrigation 0.9 % 500 mL irrigation Patient Discharge  . 0.9 % irrigation (POUR BTL) Patient Discharge    Social History:  reports that he has been smoking Cigarettes.  He has a 50 pack-year smoking history. He has never used smokeless tobacco. He reports that he drinks alcohol. He reports that he does not use illicit drugs.  Family History:  History reviewed. No pertinent family history.  CONSTITUTIONAL- No Fever, weightloss, night sweat or change in appetite. SKIN- No Rash, colour changes or itching. HEAD- No Headache or dizziness. RESPIRATORY- No Cough or SOB. CARDIAC- No Palpitations, DOE, PND or chest pain. GI- No nausea, vomiting, diarrhoea, constipation, abd pain. NEUROLOGIC-  Numbness from cervical stenosis, no syncope, seizures or burning. [facility]- Denies depression or anxiety.  Weight change:   Intake/Output Summary (Last 24 hours) at 03/04/14 1406 Last data filed at 03/04/14 1045  Gross per 24 hour  Intake   2000 ml  Output    340 ml  Net   1660 ml   BP 118/68  Pulse 101  Temp(Src) 98.4 F (36.9 C)  Resp 20  Ht 5\' 8"  (1.727 m)  Wt 209 lb 4.8 oz (94.938 kg)  BMI 31.83 kg/m2  SpO2 94% Filed Vitals:   03/04/14 1245 03/04/14 1300 03/04/14 1311 03/04/14 1326  BP:   124/67 118/68  Pulse: 103 102 98 101  Temp:      TempSrc:      Resp: 23 19 19 20   Height:      Weight:      SpO2: 97% 96% 94% 94%     Physical Exam-  GENERAL- alert, co-operative, appears as stated age, not in any distress. HEENT- Atraumatic, normocephalic, EOMI, oral mucosa appears dry. Poor dentition CARDIAC- RRR, no murmurs, rubs or gallops.  Gr 2/6 M RESP- Moving equal volumes of air, and clear to auscultation bilaterally, no wheezes or crackles. ABDOMEN- Soft, nontender,  bowel sounds present. Liver down 5 cm NEURO- No obvious Cr N abnormality, No asterexis. EXTREMITIES- pulse 2+, symmetric, Trace pitting pedal edema worse on the right. SKIN- Warm, dry, No rash or  lesion. PSYCH- Normal mood and affect, appropriate thought content and speech.  Labs: Basic Metabolic Panel:  Recent Labs Lab 03/04/14 0559  NA 144  K 3.6*  CL 100  GLUCOSE 67*  BUN 17  CREATININE 2.00*   CBC:  Recent Labs Lab 03/04/14 0559  HGB 15.6  HCT 46.0   CBG:  Recent Labs Lab 03/04/14 0649 03/04/14 0723 03/04/14 0938 03/04/14 1127  GLUCAP 144* 93 105* 119*   Xrays/Other Studies: Dg Cervical Spine 2-3 Views  03/04/2014   CLINICAL DATA:  Anterior cervical decompression, discectomy and fusion at C3-C4 and C4-C5  EXAM: CERVICAL SPINE - 2-3 VIEW  COMPARISON:  Two cross-table lateral views obtained intraoperatively are correlated without priors for comparison.  FINDINGS: Image #1 at 0800 hrs: Anterior metallic probe projects at the anterior aspect of the C3-C4 disc space. Disc space narrowing C3-C4 with minimal endplate spur formation.  Image #2 at 1041 hrs: Anterior plate and screws identified at C3-C5. Disc prostheses at intervening C3-C4 and C4-C5 disc spaces. No gross fracture or subluxation seen on assessment limited by superimposition of the shoulders. Surgical sponges project anterior to the surgical plate.  IMPRESSION: Intraoperative images obtained during anterior cervical spine fusion as above.   Electronically Signed   By: Lavonia Dana M.D.   On: 03/04/2014 11:35   Assessment/Plan: 1. Acute on chronic renal failure- Hx suggestive of Obstructive uropathy and contribution by meds- ACE-inh, With Cr- 2.00, BUN/Cr ratio- 8.5, suggestive of intrinsic renal path. Also blood pressure recording, with systolic in the 22Q causing hypoperfusion, likely contributing Possible etiologies include- Post obstructive. In the absence of abnormalities on Uss, also consider GN, pending urinalysis results. Also with an increased anion gap, but normal bicarb, likely due to AKi vs starvation. - Renal ultra sound - Urinalysis - Urine Cr, Na - Avoid Ace-inh and nsaids  - Daily Bmets -  Strict in and out - Uric acid, as home meds include allopurinol, R/o Urate nephropathy. - PTH levels - Phosphorus  2. Cervical stenosis- S/p Dissectomy, fusion and plating.  2. DM- suggest SSI  3.  HTN- can resume home meds, but hold lisinopril for now.     Kirkland Hun, PGY-2 03/04/2014, 2:06 PM  I have seen and examined this patient and agree with the plan of care seen, eval, examined, counseled patient, and discussed with family and resident.   Has late Stage 3 disease.  BPs too low causing Cr to rise most likely, xs meds. Need to r/o obstruction, Interstitial inflam.  Need to assess for secondary complic of low GFR .  Raeonna Milo L 03/04/2014, 3:55 PM

## 2014-03-04 NOTE — Anesthesia Postprocedure Evaluation (Signed)
  Anesthesia Post-op Note  Patient: Randy [name]  Procedure(s) Performed: Procedure(s) with comments: ANTERIOR CERVICAL DECOMPRESSION/DISCECTOMY FUSION 2 LEVELS (N/A) - C34 C45 anterior cervical decompression with fusion interbody prosthesis plating and bonegraft  Patient Location: PACU  Anesthesia Type:General  Level of Consciousness: awake  Airway and Oxygen Therapy: Patient Spontanous Breathing  Post-op Pain: mild  Post-op Assessment: Post-op Vital signs reviewed  Post-op Vital Signs: Reviewed  Last Vitals:  Filed Vitals:   03/04/14 1300  BP:   Pulse: 102  Temp:   Resp: 19    Complications: No apparent anesthesia complications

## 2014-03-04 NOTE — Progress Notes (Signed)
Orthopedic Tech Progress Note Patient Details:  Randy [name] 07-Nov-1945 [id number]  Post op Aspen collar applied.  Patient ID: Randy [name], male   DOB: Jul 31, 1946, 68 y.o.   MRN: [id number]   Randy [name] 03/04/2014, 4:44 PM

## 2014-03-04 NOTE — Progress Notes (Signed)
Applied patients Aspen collar per nurse.

## 2014-03-04 NOTE — Progress Notes (Signed)
CBG recheck 144, Neuro holding called and informed. Pt transported by [facility] at this time.

## 2014-03-04 NOTE — Anesthesia Preprocedure Evaluation (Signed)
Anesthesia Evaluation  Patient identified by MRN, date of birth, ID band Patient awake    Reviewed: Allergy & Precautions, H&P , NPO status , Patient's Chart, lab work & pertinent test results  Airway Mallampati: II      Dental   Pulmonary sleep apnea , Current Smoker,  breath sounds clear to auscultation        Cardiovascular negative cardio ROS  Rhythm:Regular Rate:Normal     Neuro/Psych    GI/Hepatic GERD-  ,(+) Hepatitis -  Endo/Other  diabetes  Renal/GU      Musculoskeletal   Abdominal   Peds  Hematology   Anesthesia Other Findings   Reproductive/Obstetrics                           Anesthesia Physical Anesthesia Plan  ASA: III  Anesthesia Plan: General   Post-op Pain Management:    Induction: Intravenous  Airway Management Planned: Oral ETT  Additional Equipment:   Intra-op Plan:   Post-operative Plan: Extubation in OR  Informed Consent: I have reviewed the patients History and Physical, chart, labs and discussed the procedure including the risks, benefits and alternatives for the proposed anesthesia with the patient or authorized representative who has indicated his/her understanding and acceptance.   Dental advisory given  Plan Discussed with: CRNA and Anesthesiologist  Anesthesia Plan Comments:         Anesthesia Quick Evaluation

## 2014-03-04 NOTE — Progress Notes (Addendum)
Pt reports taking 30 units SQ 70/30 at midnight after CBG was about 300. Per Istat, glucose is now 67. Dr. Conrad Mila Doce called with orders to start IV and give 1/2 amp D50. Neuro holding also called and informed. Pt denies symptoms at present, will monitor.

## 2014-03-04 NOTE — Transfer of Care (Signed)
Immediate Anesthesia Transfer of Care Note  Patient: Randy [name]  Procedure(s) Performed: Procedure(s) with comments: ANTERIOR CERVICAL DECOMPRESSION/DISCECTOMY FUSION 2 LEVELS (N/A) - C34 C45 anterior cervical decompression with fusion interbody prosthesis plating and bonegraft  Patient Location: PACU  Anesthesia Type:General  Level of Consciousness: sedated  Airway & Oxygen Therapy: Patient Spontanous Breathing and Patient placed on Ventilator (see vital sign flow sheet for setting)  Post-op Assessment: Report given to PACU RN, Post -op Vital signs reviewed and stable and Patient moving all extremities X 4  Post vital signs: Reviewed and stable  Complications: No apparent anesthesia complications

## 2014-03-04 NOTE — H&P (Signed)
Subjective: The patient is a 68 year old black male had several neck surgeries. He is a chronic weakness in his right hand. More recently the patient has had increasing neck pain, numbness, tingling, gait difficulties, et Ronney Asters. He was worked up with a cervical MRI which demonstrates C3-4 and C4-5 severe stenosis. I discussed the situation with the patient in the various treatment options including surgery. The patient has decided proceed with C3-4 and C4-5 anterior cervical discectomy, fusion, and plating.  Past Medical History  Diagnosis Date  . Sleep apnea   . GERD (gastroesophageal reflux disease)   . Diabetes mellitus without complication   . Anxiety     HAS  PTSD  . Hepatitis     1966   Hep ???    Past Surgical History  Procedure Laterality Date  . Cervical spine  x  2    . Penile prosthesis placement      not working as of 02/2014--being followed at [facility]    No Known Allergies  History  Substance Use Topics  . Smoking status: Current Every Day Smoker -- 1.00 packs/day for 50 years    Types: Cigarettes  . Smokeless tobacco: Never Used  . Alcohol Use: Yes     Comment: very  rare   moonshine    History reviewed. No pertinent family history. Prior to Admission medications   Medication Sig Start Date End Date Taking? Authorizing Provider  allopurinol (ZYLOPRIM) 100 MG tablet Take 100 mg by mouth every evening.   Yes Historical Provider, MD  atorvastatin (LIPITOR) 40 MG tablet Take 20 mg by mouth at bedtime.   Yes Historical Provider, MD  hydrochlorothiazide (HYDRODIURIL) 25 MG tablet Take 25 mg by mouth every morning.   Yes Historical Provider, MD  insulin NPH-regular Human (NOVOLIN 70/30) (70-30) 100 UNIT/ML injection Inject 45-55 Units into the skin 2 (two) times daily. 45 units in the morning and 50 units at night   Yes Historical Provider, MD  insulin regular (NOVOLIN R) 100 units/mL injection Inject 12 Units into the skin 3 (three) times daily as needed for high  blood sugar (over 250).   Yes Historical Provider, MD  lisinopril (PRINIVIL,ZESTRIL) 40 MG tablet Take 40 mg by mouth every morning.   Yes Historical Provider, MD  metoprolol (LOPRESSOR) 50 MG tablet Take 50 mg by mouth 2 (two) times daily.   Yes Historical Provider, MD  Polyvinyl Alcohol (LIQUID TEARS OP) Place 1 drop into both eyes daily as needed (dry eyes).   Yes Historical Provider, MD  terazosin (HYTRIN) 5 MG capsule Take 5 mg by mouth at bedtime.   Yes Historical Provider, MD  ibuprofen (ADVIL,MOTRIN) 800 MG tablet Take 800 mg by mouth every 8 (eight) hours as needed for moderate pain.    Historical Provider, MD     Review of Systems  Positive ROS: As above  All other systems have been reviewed and were otherwise negative with the exception of those mentioned in the HPI and as above.  Objective: Vital signs in last 24 hours: Temp:  [97.2 F (36.2 C)] 97.2 F (36.2 C) (07/09 0618) Pulse Rate:  [84] 84 (07/09 0607) Resp:  [18] 18 (07/09 0607) BP: (130)/(78) 130/78 mmHg (07/09 0607) SpO2:  [99 %] 99 % (07/09 0607) Weight:  [94.938 kg (209 lb 4.8 oz)] 94.938 kg (209 lb 4.8 oz) (07/09 0607)  General Appearance: Alert, cooperative, no distress, Head: Normocephalic, without obvious abnormality, atraumatic Eyes: PERRL, conjunctiva/corneas clear, EOM's intact,    Ears: Normal  Throat: Normal  Neck: Supple, symmetrical, trachea midline, no adenopathy; thyroid: No enlargement/tenderness/nodules; no carotid bruit or JVD Back: Symmetric, no curvature, ROM normal, no CVA tenderness Lungs: Clear to auscultation bilaterally, respirations unlabored Heart: Regular rate and rhythm, no murmur, rub or gallop Abdomen: Soft, non-tender,, no masses, no organomegaly Extremities: Extremities normal, atraumatic, no cyanosis or edema Pulses: 2+ and symmetric all extremities Skin: Skin color, texture, turgor normal, no rashes or lesions  NEUROLOGIC:   Mental status: alert and oriented, no aphasia,  good attention span, Fund of knowledge/ memory ok Motor Exam - grossly normal the patient has weakness in his right hand Sensory Exam - grossly normal Reflexes: He is hyperreflexic Coordination - grossly normal Gait - grossly normal Balance - grossly normal Cranial Nerves: I: smell Not tested  II: visual acuity  OS: Normal  OD: Normal   II: visual fields Full to confrontation  II: pupils Equal, round, reactive to light  III,VII: ptosis None  III,IV,VI: extraocular muscles  Full ROM  V: mastication Normal  V: facial light touch sensation  Normal  V,VII: corneal reflex  Present  VII: facial muscle function - upper  Normal  VII: facial muscle function - lower Normal  VIII: hearing Not tested  IX: soft palate elevation  Normal  IX,X: gag reflex Present  XI: trapezius strength  5/5  XI: sternocleidomastoid strength 5/5  XI: neck flexion strength  5/5  XII: tongue strength  Normal    Data Review Lab Results  Component Value Date   WBC 12.1* 02/24/2014   HGB 15.6 03/04/2014   HCT 46.0 03/04/2014   MCV 84.0 02/24/2014   PLT 175 02/24/2014   Lab Results  Component Value Date   NA 144 03/04/2014   K 3.6* 03/04/2014   CL 100 03/04/2014   CO2 26 02/24/2014   BUN 17 03/04/2014   CREATININE 2.00* 03/04/2014   GLUCOSE 67* 03/04/2014   No results found for this basename: INR, PROTIME    Assessment/Plan: C3-4 and C4-5 disc degeneration, spondylosis, stenosis, cervical myelopathy, cervicalgia: I discussed the situation with the patient. I have reviewed his MR scan with them and pointed out the abnormalities. We have discussed the various treatment options including surgery. I described the surgical treatment option of a C3-4 and C4-5 anterior cervical discectomy, fusion, and plating. I have shown him surgical models. We have discussed the risks, benefits, alternatives, and likelihood of achieving our goals with surgery. I've answered all the patient's questions. He has decided proceed with  surgery.   Sigurd Pugh D 03/04/2014 7:17 AM

## 2014-03-05 ENCOUNTER — Encounter (HOSPITAL_COMMUNITY): Payer: Self-pay | Admitting: Neurosurgery

## 2014-03-05 LAB — GLUCOSE, CAPILLARY
GLUCOSE-CAPILLARY: 244 mg/dL — AB (ref 70–99)
Glucose-Capillary: 190 mg/dL — ABNORMAL HIGH (ref 70–99)

## 2014-03-05 MED ORDER — OXYCODONE-ACETAMINOPHEN 10-325 MG PO TABS: 1.0000 | ORAL_TABLET | ORAL | Status: DC | PRN

## 2014-03-05 MED ORDER — DIAZEPAM 5 MG PO TABS: 5.0000 mg | ORAL_TABLET | Freq: Four times a day (QID) | ORAL | Status: DC | PRN

## 2014-03-05 MED ORDER — DSS 100 MG PO CAPS: 100.0000 mg | ORAL_CAPSULE | Freq: Two times a day (BID) | ORAL | Status: DC

## 2014-03-05 NOTE — Evaluation (Signed)
Physical Therapy Evaluation/ Discharge Patient Details Name: Randy [name] MRN: [id number] DOB: February 20, 1946 Today's Date: 03/05/2014   History of Present Illness  Pt admitted for ACDF C3-5 with hx of PTSD and prior cervical sx at [facility] in 2005  Clinical Impression  Pt pleasant gentleman with Aspen collar in place on arrival. Pt educated for cervical precautions with handout for mobility, brace wear, transfers, ADLs and function with pt able to verbalize understanding. Pt moving well without any strength or sensation deficits beyond his baseline RUE weakness. Pt does not require further therapy at this time as education complete with pt aware and agreeable to no further need.     Follow Up Recommendations No PT follow up    Equipment Recommendations  Rolling walker with 5" wheels (per pt request, did not demonstrate need at present)    Recommendations for Other Services       Precautions / Restrictions Precautions Precautions: Cervical Required Braces or Orthoses: Cervical Brace Cervical Brace: Hard collar;At all times Restrictions Weight Bearing Restrictions: No      Mobility  Bed Mobility               General bed mobility comments: pt denied bed mobiltiy stating he sleeps in a recliner  Transfers Overall transfer level: Modified independent                  Ambulation/Gait Ambulation/Gait assistance: Modified independent (Device/Increase time) Ambulation Distance (Feet): 300 Feet Assistive device: None Gait Pattern/deviations: WFL(Within Functional Limits)   Gait velocity interpretation: at or above normal speed for age/gender General Gait Details: pt without gait deviation of LOB with gait and states he feels slightly off with report of one fall in the last year  Stairs            Wheelchair Mobility    Modified Rankin (Stroke Patients Only)       Balance                                             Pertinent  Vitals/Pain HR 120 with gait sats 90% on RA with gait No pain    Home Living Family/patient expects to be discharged to:: Private residence Living Arrangements: Children Available Help at Discharge: Family;Available PRN/intermittently Type of Home: House Home Access: Level entry     Home Layout: One level Home Equipment: None      Prior Function Level of Independence: Independent               Hand Dominance        Extremity/Trunk Assessment   Upper Extremity Assessment: RUE deficits/detail RUE Deficits / Details: right hand distal IP joints contracted without ability to extend pt reports longstanding since prior cervial sx. All other strength and sensation WLF within restricted ROM from precautions         Lower Extremity Assessment: Overall WFL for tasks assessed      Cervical / Trunk Assessment: Normal  Communication   Communication: No difficulties  Cognition Arousal/Alertness: Awake/alert Behavior During Therapy: WFL for tasks assessed/performed Overall Cognitive Status: Within Functional Limits for tasks assessed                      General Comments      Exercises        Assessment/Plan    PT Assessment Patent does not need  any further PT services  PT Diagnosis     PT Problem List    PT Treatment Interventions     PT Goals (Current goals can be found in the Care Plan section) Acute Rehab PT Goals PT Goal Formulation: No goals set, d/c therapy    Frequency     Barriers to discharge        Co-evaluation               End of Session Equipment Utilized During Treatment: Cervical collar Activity Tolerance: Patient tolerated treatment well Patient left: in chair;with call bell/phone within reach Nurse Communication: Mobility status;Precautions    Functional Assessment Tool Used: clinical judgement Functional Limitation: Mobility: Walking and moving around Mobility: Walking and Moving Around Current Status (C5885): At  least 1 percent but less than 20 percent impaired, limited or restricted Mobility: Walking and Moving Around Goal Status [id number]): At least 1 percent but less than 20 percent impaired, limited or restricted Mobility: Walking and Moving Around Discharge Status [phone]): At least 1 percent but less than 20 percent impaired, limited or restricted    Time: 0913-0922 PT Time Calculation (min): 9 min   Charges:   PT Evaluation $Initial PT Evaluation Tier I: 1 Procedure PT Treatments $Therapeutic Activity: 8-22 mins   PT G Codes:   Functional Assessment Tool Used: clinical judgement Functional Limitation: Mobility: Walking and moving around    [name] 03/05/2014, 9:30 AM [name], [place]

## 2014-03-05 NOTE — Discharge Summary (Signed)
Physician Discharge Summary  Patient ID: Randy [name] MRN: [id number] DOB/AGE: 1946/05/03 68 y.o.  Admit date: 03/04/2014 Discharge date: 03/05/2014  Admission Diagnoses: C3-4 and C4-5 spondylosis, cervical myelopathy, cervical spinal stenosis, cervicalgia, renal insufficiency  Discharge Diagnoses: The same Active Problems:   Cervical spondylosis with myelopathy   Discharged Condition: good  Hospital Course: I performed a C3-4 and C4-5 anterior cervical discectomy, fusion, and plating on the patient on 03/04/2014. The surgery went well.  The patient's postoperative course was unremarkable. On postoperative day #1 he requested discharge to home. He was given oral and written discharge instructions. All his questions were answered.  I discussed the patient's renal insufficiency and the nephrologist recommendations with the patient. He prefers to have this managed at the [place]. Is going to followup with his doctors at the [place].  Consults: Neurology Significant Diagnostic Studies: None Treatments: C3-4 and C4-5 intracervical discectomy, fusion, and plating Discharge Exam: Blood pressure 112/72, pulse 89, temperature 98.5 F (36.9 C), temperature source Oral, resp. rate 16, height 5\' 8"  (1.727 m), weight 94.938 kg (209 lb 4.8 oz), SpO2 92.00%. The patient is alert and pleasant. He looks well. He is in no apparent distress. His dressing is clean and dry. There is no evidence of hematoma or shift. His strength is normal his bilateral deltoids, biceps, lower extremities  Disposition: Home  Discharge Instructions   Call MD for:  difficulty breathing, headache or visual disturbances    Complete by:  As directed      Call MD for:  extreme fatigue    Complete by:  As directed      Call MD for:  hives    Complete by:  As directed      Call MD for:  persistant dizziness or light-headedness    Complete by:  As directed      Call MD for:  persistant nausea and vomiting    Complete by:  As  directed      Call MD for:  redness, tenderness, or signs of infection (pain, swelling, redness, odor or green/yellow discharge around incision site)    Complete by:  As directed      Call MD for:  severe uncontrolled pain    Complete by:  As directed      Call MD for:  temperature >100.4    Complete by:  As directed      Diet - low sodium heart healthy    Complete by:  As directed      Discharge instructions    Complete by:  As directed   Call [phone] for a followup appointment. Take a stool softener while you are using pain medications.     Driving Restrictions    Complete by:  As directed   Do not drive for 2 weeks.     Increase activity slowly    Complete by:  As directed      Lifting restrictions    Complete by:  As directed   Do not lift more than 5 pounds. No excessive bending or twisting.     May shower / Bathe    Complete by:  As directed   He may shower after the pain she is removed 3 days after surgery. Leave the incision alone.     Remove dressing in 48 hours    Complete by:  As directed   Your stitches are under the scan and will dissolve by themselves. The Steri-Strips will fall off after you take a few showers. Do  not rub back or pick at the wound, Leave the wound alone.            Medication List    STOP taking these medications       ibuprofen 800 MG tablet  Commonly known as:  ADVIL,MOTRIN      TAKE these medications       allopurinol 100 MG tablet  Commonly known as:  ZYLOPRIM  Take 100 mg by mouth every evening.     atorvastatin 40 MG tablet  Commonly known as:  LIPITOR  Take 20 mg by mouth at bedtime.     diazepam 5 MG tablet  Commonly known as:  VALIUM  Take 1 tablet (5 mg total) by mouth every 6 (six) hours as needed for muscle spasms.     DSS 100 MG Caps  Take 100 mg by mouth 2 (two) times daily.     hydrochlorothiazide 25 MG tablet  Commonly known as:  HYDRODIURIL  Take 25 mg by mouth every morning.     insulin NPH-regular  Human (70-30) 100 UNIT/ML injection  Commonly known as:  NOVOLIN 70/30  Inject 45-55 Units into the skin 2 (two) times daily. 45 units in the morning and 50 units at night     LIQUID TEARS OP  Place 1 drop into both eyes daily as needed (dry eyes).     lisinopril 40 MG tablet  Commonly known as:  PRINIVIL,ZESTRIL  Take 40 mg by mouth every morning.     metoprolol 50 MG tablet  Commonly known as:  LOPRESSOR  Take 50 mg by mouth 2 (two) times daily.     NOVOLIN R 100 units/mL injection  Generic drug:  insulin regular  Inject 12 Units into the skin 3 (three) times daily as needed for high blood sugar (over 250).     oxyCODONE-acetaminophen 10-325 MG per tablet  Commonly known as:  PERCOCET  Take 1 tablet by mouth every 4 (four) hours as needed for pain.     terazosin 5 MG capsule  Commonly known as:  HYTRIN  Take 5 mg by mouth at bedtime.         SignedOphelia Charter 03/05/2014, 7:25 AM

## 2014-03-05 NOTE — Progress Notes (Signed)
I have personally seen and examined this patient and agree with the assessment/plan as outlined above by Denton Brick MD (PGY2). Pt DC with OP f/u at [facility].   Cellie Dardis K.,MD 03/05/2014 11:12 AM

## 2014-03-05 NOTE — Progress Notes (Signed)
S: Pt sitting on side of bed, will like to go hom t oday. No complaints. No nausea, vomiting or SOB    O:BP 119/74  Pulse 120  Temp(Src) 98.4 F (36.9 C) (Oral)  Resp 16  Ht 5\' 8"  (1.727 m)  Wt 209 lb 4.8 oz (94.938 kg)  BMI 31.83 kg/m2  SpO2 90%  Intake/Output Summary (Last 24 hours) at 03/05/14 0952 Last data filed at 03/05/14 0800  Gross per 24 hour  Intake   1692 ml  Output   2740 ml  Net  -1048 ml   Intake/Output: I/O last 3 completed shifts: In: 2572 [P.O.:240; I.V.:2232; IV Piggyback:100] Out: 2890 [Urine:2690; Blood:200]  Intake/Output this shift:  Total I/O In: 120 [P.O.:120] Out: -  Weight change:   Physical Exam-  Gen: Alert and oriented, NAD Heent: AT, [place], EOMI, cervical collar.  CVS: RRR Resp: No added sounds Abd: Soft, full, non tender. Ext: Trace pitting pedal edema. Neuro: Alert and oriented.   Recent Labs Lab 03/04/14 0559  NA 144  K 3.6*  CL 100  GLUCOSE 67*  BUN 17  CREATININE 2.00*   CBC:  Recent Labs Lab 03/04/14 0559  HGB 15.6  HCT 46.0   CBG:  Recent Labs Lab 03/04/14 1127 03/04/14 1643 03/04/14 1935 03/04/14 2346 03/05/14 0423  GLUCAP 119* 212* 220* 285* 190*   Studies/Results: Dg Cervical Spine 2-3 Views  03/04/2014   CLINICAL DATA:  Anterior cervical decompression, discectomy and fusion at C3-C4 and C4-C5  EXAM: CERVICAL SPINE - 2-3 VIEW  COMPARISON:  Two cross-table lateral views obtained intraoperatively are correlated without priors for comparison.  FINDINGS: Image #1 at 0800 hrs: Anterior metallic probe projects at the anterior aspect of the C3-C4 disc space. Disc space narrowing C3-C4 with minimal endplate spur formation.  Image #2 at 1041 hrs: Anterior plate and screws identified at C3-C5. Disc prostheses at intervening C3-C4 and C4-C5 disc spaces. No gross fracture or subluxation seen on assessment limited by superimposition of the shoulders. Surgical sponges project anterior to the surgical plate.  IMPRESSION:  Intraoperative images obtained during anterior cervical spine fusion as above.   Electronically Signed   By: Lavonia Dana M.D.   On: 03/04/2014 11:35   . allopurinol  100 mg Oral QPM  . atorvastatin  20 mg Oral QHS  . docusate sodium  100 mg Oral BID  . hydrochlorothiazide  25 mg Oral Daily  . insulin aspart  0-20 Units Subcutaneous 6 times per day  . lisinopril  40 mg Oral Daily  . metoprolol  50 mg Oral BID  . terazosin  5 mg Oral QHS    BMET    Component Value Date/Time   NA 144 03/04/2014 0559   K 3.6* 03/04/2014 0559   CL 100 03/04/2014 0559   CO2 26 02/24/2014 1418   GLUCOSE 67* 03/04/2014 0559   BUN 17 03/04/2014 0559   CREATININE 2.00* 03/04/2014 0559   CALCIUM 9.6 02/24/2014 1418   GFRNONAA 30* 02/24/2014 1418   GFRAA 34* 02/24/2014 1418   CBC    Component Value Date/Time   WBC 12.1* 02/24/2014 1418   RBC 5.00 02/24/2014 1418   HGB 15.6 03/04/2014 0559   HCT 46.0 03/04/2014 0559   PLT 175 02/24/2014 1418   MCV 84.0 02/24/2014 1418   MCH 28.8 02/24/2014 1418   MCHC 34.3 02/24/2014 1418   RDW 14.6 02/24/2014 1418     Assessment/Plan:   1.  Acute on chronic renal failure- Adanced stage 3  dx. Aetiology Acute- Prerenal- due to low BP, Vs Obstructive uropathy vs  Interstitial inflammation. In the absence of abnormalities on Uss, also consider GN, pending urinalysis results. As per Neurosurg notes, pt asked to be discharged, and want to follow up with the [place] as regards his kidney failure. - Bmet this morning pending - Renal ultra sound  - Urinalysis  - Urine Cr, Na  - Avoid Ace-inh and nsaids  - Daily Bmets  - Strict in and out  - Uric acid, as home meds include allopurinol, R/o Urate nephropathy.  - PTH levels  - Phosphorus   2. Cervical stenosis- S/p Dissectomy, fusion and plating.   2. DM- suggest SSI   3. HTN- can resume home meds, but hold lisinopril for now.    Durenda Pechacek IMTS, PGY-2. [phone].

## 2014-03-05 NOTE — Progress Notes (Signed)
Pt d/c to home, verbalized understanding of d/c information, prescriptions given, family with patient, also walker delivered and sent home with patient

## 2014-04-07 ENCOUNTER — Other Ambulatory Visit: Payer: Self-pay | Admitting: Neurosurgery

## 2014-04-07 DIAGNOSIS — N289 Disorder of kidney and ureter, unspecified: Secondary | ICD-10-CM

## 2014-04-13 ENCOUNTER — Other Ambulatory Visit: Payer: BC Managed Care – PPO

## 2014-04-14 ENCOUNTER — Ambulatory Visit
Admission: RE | Admit: 2014-04-14 | Discharge: 2014-04-14 | Disposition: A | Discharge status: Home / Self Care | Payer: BC Managed Care – PPO | Source: Ambulatory Visit | Attending: Neurosurgery | Admitting: Neurosurgery

## 2014-04-14 DIAGNOSIS — N289 Disorder of kidney and ureter, unspecified: Secondary | ICD-10-CM

## 2014-08-27 HISTORY — PX: OTHER SURGICAL HISTORY: SHX169

## 2015-02-04 ENCOUNTER — Other Ambulatory Visit: Payer: Self-pay | Admitting: Urology

## 2015-02-25 ENCOUNTER — Encounter (HOSPITAL_BASED_OUTPATIENT_CLINIC_OR_DEPARTMENT_OTHER): Payer: Self-pay | Admitting: *Deleted

## 2015-02-25 NOTE — Progress Notes (Signed)
NPO AFTER MN.  ARRIVE AT  0945.  NEEDS ISTAT 8 AND EKG.  WILL TAKE PRILOSEC AM DOS W/ SIPS OF WATER.  PT STATES WAS GIVEN INSTRUCTIONS FROM OFFICE ABOUT HIBICLENS SHOWER.

## 2015-03-04 ENCOUNTER — Ambulatory Visit (HOSPITAL_BASED_OUTPATIENT_CLINIC_OR_DEPARTMENT_OTHER): Payer: No Typology Code available for payment source | Admitting: Anesthesiology

## 2015-03-04 ENCOUNTER — Encounter (HOSPITAL_BASED_OUTPATIENT_CLINIC_OR_DEPARTMENT_OTHER)
Admission: RE | Disposition: A | Discharge status: Home / Self Care | Payer: Self-pay | Source: Ambulatory Visit | Attending: Urology

## 2015-03-04 ENCOUNTER — Ambulatory Visit (HOSPITAL_BASED_OUTPATIENT_CLINIC_OR_DEPARTMENT_OTHER)
Admission: RE | Admit: 2015-03-04 | Discharge: 2015-03-04 | Disposition: A | Discharge status: Home / Self Care | Payer: No Typology Code available for payment source | Source: Ambulatory Visit | Attending: Urology | Admitting: Urology

## 2015-03-04 ENCOUNTER — Encounter (HOSPITAL_BASED_OUTPATIENT_CLINIC_OR_DEPARTMENT_OTHER): Payer: Self-pay | Admitting: *Deleted

## 2015-03-04 DIAGNOSIS — G4730 Sleep apnea, unspecified: Secondary | ICD-10-CM | POA: Insufficient documentation

## 2015-03-04 DIAGNOSIS — Z792 Long term (current) use of antibiotics: Secondary | ICD-10-CM | POA: Diagnosis not present

## 2015-03-04 DIAGNOSIS — N19 Unspecified kidney failure: Secondary | ICD-10-CM | POA: Insufficient documentation

## 2015-03-04 DIAGNOSIS — Z79899 Other long term (current) drug therapy: Secondary | ICD-10-CM | POA: Diagnosis not present

## 2015-03-04 DIAGNOSIS — I10 Essential (primary) hypertension: Secondary | ICD-10-CM | POA: Insufficient documentation

## 2015-03-04 DIAGNOSIS — E119 Type 2 diabetes mellitus without complications: Secondary | ICD-10-CM | POA: Insufficient documentation

## 2015-03-04 DIAGNOSIS — F17200 Nicotine dependence, unspecified, uncomplicated: Secondary | ICD-10-CM | POA: Diagnosis not present

## 2015-03-04 DIAGNOSIS — Y838 Other surgical procedures as the cause of abnormal reaction of the patient, or of later complication, without mention of misadventure at the time of the procedure: Secondary | ICD-10-CM | POA: Insufficient documentation

## 2015-03-04 DIAGNOSIS — Z981 Arthrodesis status: Secondary | ICD-10-CM | POA: Diagnosis not present

## 2015-03-04 DIAGNOSIS — T83490A Other mechanical complication of penile (implanted) prosthesis, initial encounter: Secondary | ICD-10-CM | POA: Insufficient documentation

## 2015-03-04 DIAGNOSIS — Y929 Unspecified place or not applicable: Secondary | ICD-10-CM | POA: Insufficient documentation

## 2015-03-04 DIAGNOSIS — K219 Gastro-esophageal reflux disease without esophagitis: Secondary | ICD-10-CM | POA: Insufficient documentation

## 2015-03-04 DIAGNOSIS — T83490S Other mechanical complication of penile (implanted) prosthesis, sequela: Secondary | ICD-10-CM

## 2015-03-04 DIAGNOSIS — Z9989 Dependence on other enabling machines and devices: Secondary | ICD-10-CM | POA: Diagnosis not present

## 2015-03-04 DIAGNOSIS — Z791 Long term (current) use of non-steroidal anti-inflammatories (NSAID): Secondary | ICD-10-CM | POA: Diagnosis not present

## 2015-03-04 DIAGNOSIS — Z794 Long term (current) use of insulin: Secondary | ICD-10-CM | POA: Diagnosis not present

## 2015-03-04 DIAGNOSIS — N528 Other male erectile dysfunction: Secondary | ICD-10-CM | POA: Diagnosis not present

## 2015-03-04 HISTORY — DX: Post-traumatic stress disorder, unspecified: F43.10

## 2015-03-04 HISTORY — DX: Obstructive sleep apnea (adult) (pediatric): G47.33

## 2015-03-04 HISTORY — DX: Unspecified osteoarthritis, unspecified site: M19.90

## 2015-03-04 HISTORY — DX: Personal history of colonic polyps: Z86.010

## 2015-03-04 HISTORY — DX: Type 2 diabetes mellitus without complications: Z79.4

## 2015-03-04 HISTORY — DX: Other mechanical complication of implanted penile prosthesis, initial encounter: T83.490A

## 2015-03-04 HISTORY — DX: Essential (primary) hypertension: I10

## 2015-03-04 HISTORY — DX: Male erectile dysfunction, unspecified: N52.9

## 2015-03-04 HISTORY — PX: PENILE PROSTHESIS IMPLANT: SHX240

## 2015-03-04 HISTORY — DX: Dependence on other enabling machines and devices: Z99.89

## 2015-03-04 HISTORY — DX: Polyneuropathy, unspecified: G62.9

## 2015-03-04 HISTORY — DX: Personal history of other infectious and parasitic diseases: Z86.19

## 2015-03-04 HISTORY — DX: Chronic kidney disease, stage 3 (moderate): N18.3

## 2015-03-04 HISTORY — DX: Type 2 diabetes mellitus without complications: E11.9

## 2015-03-04 HISTORY — DX: Chronic kidney disease, stage 3 unspecified: N18.30

## 2015-03-04 HISTORY — DX: Presence of spectacles and contact lenses: Z97.3

## 2015-03-04 HISTORY — DX: Spondylolysis, cervical region: M43.02

## 2015-03-04 HISTORY — DX: Benign prostatic hyperplasia without lower urinary tract symptoms: N40.0

## 2015-03-04 HISTORY — DX: Personal history of adenomatous and serrated colon polyps: Z86.0101

## 2015-03-04 HISTORY — DX: Personal history of other specified conditions: Z87.898

## 2015-03-04 LAB — POCT I-STAT, CHEM 8
BUN: 21 mg/dL — ABNORMAL HIGH (ref 6–20)
Calcium, Ion: 1.3 mmol/L (ref 1.13–1.30)
Chloride: 104 mmol/L (ref 101–111)
Creatinine, Ser: 1.7 mg/dL — ABNORMAL HIGH (ref 0.61–1.24)
Glucose, Bld: 133 mg/dL — ABNORMAL HIGH (ref 65–99)
HCT: 46 % (ref 39.0–52.0)
Hemoglobin: 15.6 g/dL (ref 13.0–17.0)
Potassium: 4.1 mmol/L (ref 3.5–5.1)
Sodium: 140 mmol/L (ref 135–145)
TCO2: 22 mmol/L (ref 0–100)

## 2015-03-04 LAB — GLUCOSE, CAPILLARY: Glucose-Capillary: 140 mg/dL — ABNORMAL HIGH (ref 65–99)

## 2015-03-04 SURGERY — INSERTION, PENILE PROSTHESIS, INFLATABLE
Anesthesia: General | Site: Penis

## 2015-03-04 MED ORDER — GLYCOPYRROLATE 0.2 MG/ML IJ SOLN
INTRAMUSCULAR | Status: DC | PRN
  Administered 2015-03-04: 0.6 mg via INTRAVENOUS

## 2015-03-04 MED ORDER — SODIUM CHLORIDE 0.9 % IV SOLN
INTRAVENOUS | Status: DC
  Administered 2015-03-04 (×4): via INTRAVENOUS
  Filled 2015-03-04: qty 1000

## 2015-03-04 MED ORDER — OXYCODONE HCL 5 MG PO TABS
ORAL_TABLET | ORAL | Status: AC
  Filled 2015-03-04: qty 2

## 2015-03-04 MED ORDER — CIPROFLOXACIN HCL 500 MG PO TABS: 500.0000 mg | ORAL_TABLET | Freq: Two times a day (BID) | ORAL | Status: DC

## 2015-03-04 MED ORDER — FENTANYL CITRATE (PF) 100 MCG/2ML IJ SOLN
INTRAMUSCULAR | Status: DC | PRN
  Administered 2015-03-04: 12.5 ug via INTRAVENOUS
  Administered 2015-03-04 (×3): 25 ug via INTRAVENOUS
  Administered 2015-03-04 (×2): 12.5 ug via INTRAVENOUS
  Administered 2015-03-04: 25 ug via INTRAVENOUS
  Administered 2015-03-04 (×3): 12.5 ug via INTRAVENOUS
  Administered 2015-03-04: 25 ug via INTRAVENOUS

## 2015-03-04 MED ORDER — CEFAZOLIN SODIUM-DEXTROSE 2-3 GM-% IV SOLR
INTRAVENOUS | Status: AC
  Filled 2015-03-04: qty 50

## 2015-03-04 MED ORDER — LACTATED RINGERS IV SOLN
INTRAVENOUS | Status: DC | PRN
  Administered 2015-03-04: 12:00:00 via INTRAVENOUS

## 2015-03-04 MED ORDER — OXYCODONE HCL 5 MG PO TABS
10.0000 mg | ORAL_TABLET | Freq: Once | ORAL | Status: AC
  Administered 2015-03-04: 10 mg via ORAL
  Filled 2015-03-04: qty 2

## 2015-03-04 MED ORDER — ONDANSETRON HCL 4 MG/2ML IJ SOLN
INTRAMUSCULAR | Status: DC | PRN
  Administered 2015-03-04: 4 mg via INTRAVENOUS

## 2015-03-04 MED ORDER — LACTATED RINGERS IV SOLN: INTRAVENOUS | Status: DC | PRN

## 2015-03-04 MED ORDER — FENTANYL CITRATE (PF) 100 MCG/2ML IJ SOLN
INTRAMUSCULAR | Status: AC
  Filled 2015-03-04: qty 2

## 2015-03-04 MED ORDER — CEFAZOLIN SODIUM-DEXTROSE 2-3 GM-% IV SOLR
2.0000 g | INTRAVENOUS | Status: AC
  Administered 2015-03-04: 2 g via INTRAVENOUS
  Filled 2015-03-04: qty 50

## 2015-03-04 MED ORDER — ROCURONIUM BROMIDE 100 MG/10ML IV SOLN
INTRAVENOUS | Status: DC | PRN
  Administered 2015-03-04: 20 mg via INTRAVENOUS

## 2015-03-04 MED ORDER — LIDOCAINE HCL (CARDIAC) 20 MG/ML IV SOLN
INTRAVENOUS | Status: DC | PRN
  Administered 2015-03-04: 80 mg via INTRAVENOUS

## 2015-03-04 MED ORDER — SUCCINYLCHOLINE CHLORIDE 20 MG/ML IJ SOLN
INTRAMUSCULAR | Status: DC | PRN
  Administered 2015-03-04: 100 mg via INTRAVENOUS

## 2015-03-04 MED ORDER — DEXAMETHASONE SODIUM PHOSPHATE 10 MG/ML IJ SOLN
INTRAMUSCULAR | Status: DC | PRN
  Administered 2015-03-04: 4 mg via INTRAVENOUS

## 2015-03-04 MED ORDER — ACETAMINOPHEN 10 MG/ML IV SOLN
INTRAVENOUS | Status: DC | PRN
  Administered 2015-03-04: 1000 mg via INTRAVENOUS

## 2015-03-04 MED ORDER — BUPIVACAINE HCL 0.5 % IJ SOLN
INTRAMUSCULAR | Status: DC | PRN
  Administered 2015-03-04: 10 mL

## 2015-03-04 MED ORDER — LACTATED RINGERS IV SOLN
INTRAVENOUS | Status: DC
  Filled 2015-03-04: qty 1000

## 2015-03-04 MED ORDER — METOCLOPRAMIDE HCL 5 MG/ML IJ SOLN
INTRAMUSCULAR | Status: DC | PRN
  Administered 2015-03-04: 10 mg via INTRAVENOUS

## 2015-03-04 MED ORDER — PHENYLEPHRINE HCL 10 MG/ML IJ SOLN
10.0000 mg | INTRAVENOUS | Status: DC | PRN
  Administered 2015-03-04: 100 ug/min via INTRAVENOUS

## 2015-03-04 MED ORDER — SODIUM CHLORIDE 0.9 % IR SOLN
Status: DC | PRN
  Administered 2015-03-04: 500 mL

## 2015-03-04 MED ORDER — PHENYLEPHRINE HCL 10 MG/ML IJ SOLN
INTRAMUSCULAR | Status: DC | PRN
  Administered 2015-03-04 (×2): 40 ug via INTRAVENOUS

## 2015-03-04 MED ORDER — FENTANYL CITRATE (PF) 100 MCG/2ML IJ SOLN
INTRAMUSCULAR | Status: AC
  Filled 2015-03-04: qty 6

## 2015-03-04 MED ORDER — OXYCODONE HCL 10 MG PO TABS
10.0000 mg | ORAL_TABLET | ORAL | Status: DC | PRN
Start: 2015-03-04 — End: 2016-04-06

## 2015-03-04 MED ORDER — GENTAMICIN SULFATE 40 MG/ML IJ SOLN
5.0000 mg/kg | INTRAVENOUS | Status: AC
  Administered 2015-03-04: 390 mg via INTRAVENOUS
  Filled 2015-03-04: qty 9.75

## 2015-03-04 MED ORDER — NEOSTIGMINE METHYLSULFATE 10 MG/10ML IV SOLN
INTRAVENOUS | Status: DC | PRN
  Administered 2015-03-04: 4 mg via INTRAVENOUS

## 2015-03-04 MED ORDER — FENTANYL CITRATE (PF) 100 MCG/2ML IJ SOLN
25.0000 ug | INTRAMUSCULAR | Status: DC | PRN
  Administered 2015-03-04: 50 ug via INTRAVENOUS
  Filled 2015-03-04: qty 1

## 2015-03-04 MED ORDER — PROPOFOL 10 MG/ML IV BOLUS
INTRAVENOUS | Status: DC | PRN
  Administered 2015-03-04 (×2): 50 mg via INTRAVENOUS
  Administered 2015-03-04: 200 mg via INTRAVENOUS
  Administered 2015-03-04: 100 mg via INTRAVENOUS

## 2015-03-04 SURGICAL SUPPLY — 83 items
APL SKNCLS STERI-STRIP NONHPOA (GAUZE/BANDAGES/DRESSINGS) ×1
APPLICATOR COTTON TIP 6IN STRL (MISCELLANEOUS) IMPLANT
BAG DECANTER FOR FLEXI CONT (MISCELLANEOUS) IMPLANT
BAG URINE DRAINAGE (UROLOGICAL SUPPLIES) ×2 IMPLANT
BANDAGE CO FLEX L/F 2IN X 5YD (GAUZE/BANDAGES/DRESSINGS) IMPLANT
BENZOIN TINCTURE PRP APPL 2/3 (GAUZE/BANDAGES/DRESSINGS) ×2 IMPLANT
BLADE 15 SAFETY STRL DISP (BLADE) ×2 IMPLANT
BLADE CLIPPER SURG (BLADE) ×1 IMPLANT
BLADE HEX COATED 2.75 (ELECTRODE) ×2 IMPLANT
BLADE SURG 10 STRL SS (BLADE) IMPLANT
BLADE SURG 15 STRL LF DISP TIS (BLADE) ×1 IMPLANT
BLADE SURG 15 STRL SS (BLADE) ×2
BNDG GAUZE ELAST 4 BULKY (GAUZE/BANDAGES/DRESSINGS) ×2 IMPLANT
CANISTER SUCTION 1200CC (MISCELLANEOUS) IMPLANT
CANISTER SUCTION 2500CC (MISCELLANEOUS) IMPLANT
CATH FOLEY 2WAY SLVR  5CC 16FR (CATHETERS) ×1
CATH FOLEY 2WAY SLVR 5CC 16FR (CATHETERS) ×1 IMPLANT
CHLORAPREP W/TINT 26ML (MISCELLANEOUS) ×2 IMPLANT
CLOTH BEACON ORANGE TIMEOUT ST (SAFETY) ×2 IMPLANT
COVER BACK TABLE 60X90IN (DRAPES) ×2 IMPLANT
COVER MAYO STAND STRL (DRAPES) ×4 IMPLANT
DISSECTOR ROUND CHERRY 3/8 STR (MISCELLANEOUS) ×1 IMPLANT
DRAPE EXTREMITY T 121X128X90 (DRAPE) ×2 IMPLANT
DRAPE LAPAROTOMY TRNSV 102X78 (DRAPE) IMPLANT
DRSG TEGADERM 4X4.75 (GAUZE/BANDAGES/DRESSINGS) ×2 IMPLANT
DRSG TELFA 3X8 NADH (GAUZE/BANDAGES/DRESSINGS) ×2 IMPLANT
ELECT REM PT RETURN 9FT ADLT (ELECTROSURGICAL) ×2
ELECTRODE REM PT RTRN 9FT ADLT (ELECTROSURGICAL) ×1 IMPLANT
GLOVE BIO SURGEON STRL SZ8 (GLOVE) ×2 IMPLANT
GLOVE BIOGEL M 6.5 STRL (GLOVE) ×2 IMPLANT
GLOVE BIOGEL PI IND STRL 6.5 (GLOVE) IMPLANT
GLOVE BIOGEL PI IND STRL 7.5 (GLOVE) IMPLANT
GLOVE BIOGEL PI INDICATOR 6.5 (GLOVE) ×2
GLOVE BIOGEL PI INDICATOR 7.5 (GLOVE) ×1
GLOVE SURG SS PI 6.5 STRL IVOR (GLOVE) ×1 IMPLANT
GOWN STRL REUS W/ TWL LRG LVL3 (GOWN DISPOSABLE) ×1 IMPLANT
GOWN STRL REUS W/ TWL XL LVL3 (GOWN DISPOSABLE) ×1 IMPLANT
GOWN STRL REUS W/TWL LRG LVL3 (GOWN DISPOSABLE) ×10
GOWN STRL REUS W/TWL XL LVL3 (GOWN DISPOSABLE) ×2
HOLDER FOLEY CATH W/STRAP (MISCELLANEOUS) ×2 IMPLANT
IV NS 250ML (IV SOLUTION) ×2
IV NS 250ML BAXH (IV SOLUTION) ×1 IMPLANT
KIT TITAN ASSEMBLY (Erectile Restoration) ×1 IMPLANT
KIT TITAN ASSEMBLY STANDARD (Erectile Restoration) ×1 IMPLANT
KIT TITAN ASSEMBLY STD (Erectile Restoration) IMPLANT
LIQUID BAND (GAUZE/BANDAGES/DRESSINGS) ×1 IMPLANT
MANIFOLD NEPTUNE II (INSTRUMENTS) IMPLANT
NDL HYPO 25X1 1.5 SAFETY (NEEDLE) ×1 IMPLANT
NEEDLE HYPO 25X1 1.5 SAFETY (NEEDLE) ×2 IMPLANT
NS IRRIG 500ML POUR BTL (IV SOLUTION) ×2 IMPLANT
PACK BASIN DAY SURGERY FS (CUSTOM PROCEDURE TRAY) ×2 IMPLANT
PAD DRESSING TELFA 3X8 NADH (GAUZE/BANDAGES/DRESSINGS) IMPLANT
PENCIL BUTTON HOLSTER BLD 10FT (ELECTRODE) ×2 IMPLANT
PLUG CATH AND CAP STER (CATHETERS) ×2 IMPLANT
PROS TITAN INFRA 0 ANG 20CM (Erectile Restoration) ×2 IMPLANT
PROSTHESIS TTN INFR 0 ANG 20CM (Erectile Restoration) IMPLANT
RESERVOIR TITAN 12.5CC W/VLV ×1 IMPLANT
RETRACTOR WILSON SYSTEM (INSTRUMENTS) ×2 IMPLANT
SPONGE GAUZE 4X4 12PLY STER LF (GAUZE/BANDAGES/DRESSINGS) ×2 IMPLANT
SPONGE LAP 18X18 X RAY DECT (DISPOSABLE) IMPLANT
SPONGE LAP 4X18 X RAY DECT (DISPOSABLE) ×6 IMPLANT
STRIP CLOSURE SKIN 1/2X4 (GAUZE/BANDAGES/DRESSINGS) IMPLANT
SUPPORT SCROTAL LG STRP (MISCELLANEOUS) IMPLANT
SURGILUBE 2OZ TUBE FLIPTOP (MISCELLANEOUS) ×2 IMPLANT
SUT CHROMIC 3 0 SH 27 (SUTURE) ×6 IMPLANT
SUT MNCRL AB 4-0 PS2 18 (SUTURE) IMPLANT
SUT PDS AB 2-0 CT2 27 (SUTURE) ×6 IMPLANT
SUT VIC AB 2-0 UR6 27 (SUTURE) ×4 IMPLANT
SUT VIC AB 3-0 SH 27 (SUTURE)
SUT VIC AB 3-0 SH 27X BRD (SUTURE) IMPLANT
SUT VICRYL 0 UR6 27IN ABS (SUTURE) IMPLANT
SUT VICRYL 4-0 PS2 18IN ABS (SUTURE) IMPLANT
SYR 20CC LL (SYRINGE) ×2 IMPLANT
SYR 50ML LL SCALE MARK (SYRINGE) ×4 IMPLANT
SYR BULB IRRIGATION 50ML (SYRINGE) ×2 IMPLANT
SYR CONTROL 10ML LL (SYRINGE) ×2 IMPLANT
SYRINGE 10CC LL (SYRINGE) ×2 IMPLANT
TOWEL OR 17X24 6PK STRL BLUE (TOWEL DISPOSABLE) ×4 IMPLANT
TRAY DSU PREP LF (CUSTOM PROCEDURE TRAY) ×2 IMPLANT
TUBE CONNECTING 12X1/4 (SUCTIONS) ×2 IMPLANT
WATER STERILE IRR 3000ML UROMA (IV SOLUTION) IMPLANT
WATER STERILE IRR 500ML POUR (IV SOLUTION) ×2 IMPLANT
YANKAUER SUCT BULB TIP NO VENT (SUCTIONS) ×2 IMPLANT

## 2015-03-04 NOTE — Anesthesia Procedure Notes (Addendum)
Procedure Name: LMA Insertion Date/Time: 03/04/2015 11:26 AM Performed by: Justice Rocher Pre-anesthesia Checklist: Patient identified, Emergency Drugs available, Suction available and Patient being monitored Patient Re-evaluated:Patient Re-evaluated prior to inductionOxygen Delivery Method: Circle System Utilized Preoxygenation: Pre-oxygenation with 100% oxygen Intubation Type: IV induction Ventilation: Mask ventilation without difficulty LMA: LMA inserted LMA Size: 5.0 Number of attempts: 1 Airway Equipment and Method: Bite block Placement Confirmation: positive ETCO2 Tube secured with: Tape Dental Injury: Teeth and Oropharynx as per pre-operative assessment    Procedure Name: Intubation Date/Time: 03/04/2015 12:24 PM Performed by: Justice Rocher Pre-anesthesia Checklist: Patient identified, Emergency Drugs available, Suction available and Patient being monitored Patient Re-evaluated:Patient Re-evaluated prior to inductionOxygen Delivery Method: Circle System Utilized Preoxygenation: Pre-oxygenation with 100% oxygen Intubation Type: IV induction Ventilation: Mask ventilation without difficulty Laryngoscope Size: Glidescope and 4 Tube type: Oral Tube size: 8.0 mm Number of attempts: 1 Airway Equipment and Method: Stylet and Oral airway Placement Confirmation: ETT inserted through vocal cords under direct vision,  positive ETCO2 and breath sounds checked- equal and bilateral Secured at: 22 cm Tube secured with: Tape Dental Injury: Teeth and Oropharynx as per pre-operative assessment

## 2015-03-04 NOTE — Anesthesia Preprocedure Evaluation (Addendum)
Anesthesia Evaluation  Patient identified by MRN, date of birth, ID band Patient awake    Reviewed: Allergy & Precautions, H&P , NPO status , Patient's Chart, lab work & pertinent test results  Airway Mallampati: III  TM Distance: >3 FB Neck ROM: full    Dental no notable dental hx. (+) Dental Advisory Given, Edentulous Upper, Missing Most of lower teeth missing too:   Pulmonary neg pulmonary ROS, sleep apnea and Continuous Positive Airway Pressure Ventilation , Current Smoker,  breath sounds clear to auscultation  Pulmonary exam normal       Cardiovascular Exercise Tolerance: Good hypertension, Pt. on medications negative cardio ROS Normal cardiovascular examRhythm:regular Rate:Normal     Neuro/Psych Anxiety Cervical fusion negative neurological ROS  negative psych ROS   GI/Hepatic negative GI ROS, Neg liver ROS,   Endo/Other  negative endocrine ROSdiabetes, Well Controlled, Type 2, Insulin Dependent  Renal/GU negative Renal ROS  negative genitourinary   Musculoskeletal   Abdominal   Peds  Hematology negative hematology ROS (+)   Anesthesia Other Findings Has throat problems after cervical fusion  Reproductive/Obstetrics negative OB ROS                            Anesthesia Physical Anesthesia Plan  ASA: III  Anesthesia Plan: General   Post-op Pain Management:    Induction: Intravenous  Airway Management Planned: LMA  Additional Equipment:   Intra-op Plan:   Post-operative Plan:   Informed Consent: I have reviewed the patients History and Physical, chart, labs and discussed the procedure including the risks, benefits and alternatives for the proposed anesthesia with the patient or authorized representative who has indicated his/her understanding and acceptance.   Dental Advisory Given  Plan Discussed with: CRNA and Surgeon  Anesthesia Plan Comments:          Anesthesia Quick Evaluation

## 2015-03-04 NOTE — Discharge Instructions (Signed)
Penile prosthesis postoperative instructions ° °Wound: ° °In most cases your incision will have absorbable sutures that will dissolve within the first 10-20 days. Some will fall out even earlier. Expect some redness as the sutures dissolved but this should occur only around the sutures. If there is generalized redness, especially with increasing pain or swelling, let us know. The scrotum and penis will very likely get "black and blue" as the blood in the tissues spread. Sometimes the whole scrotum will turn colors. The black and blue is followed by a yellow and brown color. In time, all the discoloration will go away. In some cases some firm swelling in the area of the testicle and pump may persist for up to 4-6 weeks after the surgery and is considered normal in most cases. ° °Diet: ° °You may return to your normal diet within 24 hours following your surgery. You may note some mild nausea and possibly vomiting the first 6-8 hours following surgery. This is usually due to the side effects of anesthesia, and will disappear quite soon. I would suggest clear liquids and a very light meal the first evening following your surgery. ° °Activity: ° °Your physical activity should be restricted the first 48 hours. During that time you should remain relatively inactive, moving about only when necessary. During the first 7-10 days following surgery he should avoid lifting any heavy objects (anything greater than 15 pounds), and avoid strenuous exercise. If you work, ask us specifically about your restrictions, both for work and home. We will write a note to your employer if needed. ° °You should plan to wear a tight pair of jockey shorts or an athletic supporter for the first 4-5 days, even to sleep. This will keep the scrotum immobilized to some degree and keep the swelling down.The position of your penis will determine what is most comfortable but I strongly urge you to keep the penis in the "up" position (toward your  head). ° °Ice packs should be placed on and off over the scrotum for the first 48 hours. Frozen peas or corn in a ZipLock bag can be frozen, used and re-frozen. Fifteen minutes on and 15 minutes off is a reasonable schedule. The ice is a good pain reliever and keeps the swelling down. ° °Hygiene: ° °You may shower 48 hours after your surgery. Tub bathing should be restricted until the seventh day. ° °Medication: ° °You will be sent home with some type of pain medication. In many cases you will be sent home with a narcotic pain pill (hydrocodone or oxycodone). If the pain is not too bad, you may take either Tylenol (acetaminophen) or Advil (ibuprofen) which contain no narcotic agents, and might be tolerated a little better, with fewer side effects. If the pain medication you are sent home with does not control the pain, you will have to let us know. Some narcotic pain medications cannot be given or refilled by a phone call to a pharmacy. ° °Problems you should report to us: ° °· Fever of 101.0 degrees Fahrenheit or greater. °· Moderate or severe swelling under the skin incision or involving the scrotum. °Drug reaction such as hives, a rash, nausea or vomiting. ° ° ° °Post Anesthesia Home Care Instructions ° °Activity: °Get plenty of rest for the remainder of the day. A responsible adult should stay with you for 24 hours following the procedure.  °For the next 24 hours, DO NOT: °-Drive a car °-Operate machinery °-Drink alcoholic beverages °-Take any medication unless   instructed by your physician °-Make any legal decisions or sign important papers. ° °Meals: °Start with liquid foods such as gelatin or soup. Progress to regular foods as tolerated. Avoid greasy, spicy, heavy foods. If nausea and/or vomiting occur, drink only clear liquids until the nausea and/or vomiting subsides. Call your physician if vomiting continues. ° °Special Instructions/Symptoms: °Your throat may feel dry or sore from the anesthesia or the  breathing tube placed in your throat during surgery. If this causes discomfort, gargle with warm salt water. The discomfort should disappear within 24 hours. ° °If you had a scopolamine patch placed behind your ear for the management of post- operative nausea and/or vomiting: ° °1. The medication in the patch is effective for 72 hours, after which it should be removed.  Wrap patch in a tissue and discard in the trash. Wash hands thoroughly with soap and water. °2. You may remove the patch earlier than 72 hours if you experience unpleasant side effects which may include dry mouth, dizziness or visual disturbances. °3. Avoid touching the patch. Wash your hands with soap and water after contact with the patch. °  ° °

## 2015-03-04 NOTE — Op Note (Signed)
PATIENT:  Randy [name]  PRE-OPERATIVE DIAGNOSIS:  1. Malfunctioning 3 piece inflatable penile prosthesis 2. Organic erectile dysfunction  POST-OPERATIVE DIAGNOSIS:  Same  PROCEDURE:  Procedure(s): 1. Removal of malfunctioning 3 piece inflatable prosthesis. 2. 3 piece inflatable prosthesis reimplantation ([employer])  SURGEON:  Claybon Jabs  INDICATION: He has had long-standing organic erectile dysfunction and underwent a 3 piece inflatable prosthesis implantation about 17 years ago. About 3-4 years ago the prosthesis ceased to function. He was found on examination to have an nontender device that appeared to have lost fluid and we therefore discussed proceeding with replacement of the malfunctioning device. He has elected to proceed with prosthesis implantation.  ANESTHESIA:  General  EBL:  Minimal  Device: 3 piece Titan: 125 cc reservoir, 20 cm cylinders and 2 cm rear-tip extenders on right and left sides.  LOCAL MEDICATIONS USED:  10 mL of 1/2% Marcaine  SPECIMEN: Explanted prosthesis sent to the manufacturer.   Description of procedure: The patient was taken to the major operating room, placed on the table and administered general anesthesia in the supine position. His genitalia was then scrubbed for 10 minutes, painted and then sterilely draped. An official timeout was then performed.  A 16 French Foley catheter was placed in the bladder and the bladder was drained and the catheter was plugged. I then made a transverse incision in the suprapubic region and carried this down until I found the tubing of his prosthesis. I cut down on the tubing using the Bovie and identified to clear tubes going from the cylinders to the pump and a black tubing going to the reservoir. As I continued to cut down on the tubing going to the reservoir to expose it further I found that this is where the device had failed. The tubing had broken. Because the tubing had broken completely I was not able to  easily find the tubing from the reservoir and felt that it would be better to leave the reservoir in situ rather than try to find the tubing and try to explant this since there was no evidence of infection and it was not causing any difficulties or symptoms.  I then cut down on the tubing as I withdrew the pump from the scrotum and was able to completely exposed the pump. There was a great deal of dystrophic calcification associated with the capsule so I excised the capsule completely. I irrigated with an antibiotic solution and then turned my attention to the tubing going to each of the corpora. I cut down on the tubing until I was able to visualize the cylinder first on the left-hand side. I placed 2-0 PDS sutures into the corporal edges as stay sutures and then opened the corporotomy further, placed a right angle clamp beneath the cylinder and explanted it and the attached rear-tip extender. I then irrigated copiously proximally and distally in the corpora with antibiotic solution. I then explanted the other cylinder in an identical fashion. After both corporotomies were easily visualized I placed a second set of holding sutures in the corporotomy in preparation for closure.  I measured the corpora proximally and distally and on both sides then measured 12 cm proximally and 10 cm distally. I chose a 20 cm cylinder with 2 cm rear-tip extenders. I then used the [facility] inserter to pass the suture affixed to the end of each cylinder out through the end of the glans and used this to position the cylinder in each of the corpora. I then tested these  by inflating the cylinders to their maximum and noted that the SST deformity which he previously had was markedly improved. It was not completely resolved but still much improved that I did not feel any form of plication was indicated. It appeared there may have been some issue with the length of the cylinders that were previously placed but these new cylinders appeared  to have corrected the problem for the most part. 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 I therefore deflated the device and turned my attention to placement of the reservoir.  I completely drained the bladder. I used blunt technique to dissect up to the external inguinal ring on the right-hand side.. I swept the spermatic cord laterally and then was able to bluntly dissect with my index finger through the floor of the inguinal ring. I drained the bladder completely with a Foley catheter and then developed a space behind the symphysis pubis for the reservoir. I irrigated the space with antibiotic solution and then placed the reservoir in this location. I then filled the reservoir with 80 cc of sterile saline.  I then closed the corporotomies by tying each of the proposed stay sutures. The cylinder was then connected to the pump after excising the excess tubing with appropriate shodded hemostats in place and then I used the supplied connectors to make the connection. I then again cycled the device with the pump and it cycled properly. I deflated the device and pumped it up about three quarters of the way to aid with hemostasis. I irrigated the incision again with antibiotic solution.  I then placed the pump in the left hemiscrotum. It laid in excellent position and was easily palpable. I therefore turned my attention to the incision and irrigated this 1 more time with antibody solution. I then closed the deep tissue with running 20 Vicryls suture. Local and acetic was injected in the subcutaneous tissue and the incision was closed with a running 4-0 Monocryl suture. I applied Dermabond to the incision and after this dried applied Tegaderm and an OpSite dressing. The catheter was connected to closed system drainage and the patient was awakened and taken recovery room in stable and satisfactory condition. He tolerated the procedure  well and there were no intraoperative complications. Needle sponge and instrument counts were correct at the end of the operation.  PLAN OF CARE: He will be discharged home once fully recovered.  PATIENT DISPOSITION:  PACU - hemodynamically stable.

## 2015-03-04 NOTE — H&P (Signed)
Randy [name] is a 69 year old male with a malfunctioning penile prosthesis.   History of Present Illness LUTS: He has had hesitancy as well as urgency and urge incontinence. This had been controlled with terazosin 5 mg.  Current therapy: Terazosin 10 mg    Erectile dysfunction: He underwent inflatable penile prosthesis implantation 17 years ago by Dr. Janice Norrie. The device has malfunctioned and he is interested in replacement. He said it ceased to function about 3-4 years ago. It is not causing any pain. He does report that he had an episode of priapism prior to the prosthesis being inserted and when it was inserted because of scar tissue in the distal aspect of the corpora he had some degree of floppiness to the glans. He does not have any significant voiding symptoms.    Past Medical History Problems  1. History of diabetes mellitus (Z86.39) 2. History of esophageal reflux (Z87.19) 3. History of renal failure (Z87.448) 4. History of sleep apnea (Z87.09)  Surgical History Problems  1. History of Neck Surgery 2. Penile Prosthetic Device Inflatable  Current Meds 1. Allopurinol 100 MG Oral Tablet;  Therapy: (Recorded:01Jun2015) to Recorded 2. Atorvastatin Calcium 40 MG Oral Tablet;  Therapy: (Recorded:01Jun2015) to Recorded 3. Clotrimazole 1 % External Cream;  Therapy: (Recorded:01Jun2015) to Recorded 4. Erythromycin SOLN;  Therapy: ([id number]) to Recorded 5. Finasteride 5 MG Oral Tablet;  Therapy: (Recorded:01Jun2015) to Recorded 6. FLUoxetine HCl - 20 MG Oral Capsule;  Therapy: (Recorded:01Jun2015) to Recorded 7. Hydrochlorothiazide 25 MG Oral Tablet;  Therapy: (Recorded:01Jun2015) to Recorded 8. Ibuprofen 800 MG Oral Tablet;  Therapy: (Recorded:01Jun2015) to Recorded 9. Lactulose SOLN;  Therapy: (Recorded:01Jun2015) to Recorded 10. Lisinopril 40 MG Oral Tablet;   Therapy: (Recorded:01Jun2015) to Recorded 11. Loratadine 10 MG Oral Tablet;   Therapy:  (Recorded:01Jun2015) to Recorded 12. Meloxicam 15 MG Oral Tablet;   Therapy: (Recorded:01Jun2015) to Recorded 13. Metoprolol Tartrate 50 MG Oral Tablet;   Therapy: (Recorded:01Jun2015) to Recorded 14. Mupirocin 2 % External Ointment;   Therapy: (Recorded:01Jun2015) to Recorded 15. NovoLIN 70/30 [place] 70-30 % SUSP;   Therapy: (Recorded:01Jun2015) to Recorded 16. Omeprazole 40 MG Oral Capsule Delayed Release;   Therapy: (Recorded:01Jun2015) to Recorded 17. Simvastatin 40 MG Oral Tablet;   Therapy: (Recorded:01Jun2015) to Recorded 18. Terazosin HCl - 5 MG Oral Capsule;   Therapy: (Recorded:01Jun2015) to Recorded 19. Terazosin HCl - 5 MG Oral Capsule;   Therapy: [id number] to Recorded 20. TraZODone HCl - 50 MG Oral Tablet;   Therapy: (Recorded:01Jun2015) to Recorded  Allergies Medication  1. No Known Drug Allergies  Family History Problems  1. Family history of prostate cancer (Z80.42) : Brother  Social History Problems  1. Denied: History of Alcohol use 2. Caffeine use (F15.90) 3. Former smoker [phone])   1PPD quit 1965 4. Married  Review of Systems Genitourinary, constitutional, skin, eye, otolaryngeal, hematologic/lymphatic, cardiovascular, pulmonary, endocrine, musculoskeletal, gastrointestinal, neurological and psychiatric system(s) were reviewed and pertinent findings if present are noted.  Genitourinary: urinary frequency, feelings of urinary urgency, dysuria, nocturia, incontinence, difficulty starting the urinary stream, weak urinary stream, incomplete emptying of bladder, erectile dysfunction and initiating urination requires straining.  Gastrointestinal: constipation.  Constitutional: feeling tired (fatigue).  Integumentary: pruritus.  ENT: sinus problems.  Respiratory: shortness of breath and cough.  Musculoskeletal: back pain and joint pain.  Neurological: headache and dizziness.  Psychiatric: anxiety and depression.    Vitals Vital Signs  Height: 5 ft  7.5 in Weight: 210 lb  BMI Calculated: 32.41 BSA Calculated: 2.08 Blood Pressure: 121 / 80 Temperature: 97.4  F Heart Rate: 88  Physical Exam Constitutional: Well nourished and well developed . No acute distress.  ENT:. The ears and nose are normal in appearance.  Neck: The appearance of the neck is normal and no neck mass is present.  Pulmonary: No respiratory distress and normal respiratory rhythm and effort.  Cardiovascular: Heart rate and rhythm are normal . No peripheral edema.  Abdomen: The abdomen is soft and nontender. No masses are palpated. No CVA tenderness. No hernias are palpable. No hepatosplenomegaly noted.  Rectal: Rectal exam demonstrates normal sphincter tone, no tenderness and no masses. Estimated prostate size is 2+. The prostate has no nodularity and is not tender. The left seminal vesicle is nonpalpable. The right seminal vesicle is nonpalpable. The perineum is normal on inspection.  Genitourinary: Examination of the penis demonstrates no discharge, no masses, no lesions and a normal meatus. The scrotum is without lesions. The right epididymis is palpably normal and non-tender. The left epididymis is palpably normal and non-tender. The right testis is non-tender and without masses. The left testis is non-tender and without masses. I was not able to inflate the prosthesis. The inflate deflate pump is in the left scrotum.  Lymphatics: The femoral and inguinal nodes are not enlarged or tender.  Skin: Normal skin turgor, no visible rash and no visible skin lesions.  Neuro/Psych:. Mood and affect are appropriate.     Assessment  The device is not functioning properly. When the pump is depressed and does not refill indicating loss of fluid. It is located in the left hemiscrotum as he is left-handed.    I discussed with the patient what would be required to replace his malfunctioning penile prosthesis. We discussed the fact that I would first explore him and attempt to  determine the location of the malfunction. Because his prosthesis has been in for number of years if I found the malfunction to be located in the pump I would also consider replacement of the cylinders. If there has been malfunction of the reservoir then I would also need to replace this otherwise this may not need to be replaced. We discussed the procedure in detail including the incision used, the risks and complications, the probability of success as well as the anticipated postoperative course. He also understands that I will try to achieve further distal length in order to diminish the amount of SST deformity/floppy glans although I told him I do not know how much of this I will be able to correct.   Plan   He will be scheduled for replacement of his malfunctioning 3 piece [place] penile prosthesis via an infrapubic approach.

## 2015-03-04 NOTE — Anesthesia Postprocedure Evaluation (Signed)
  Anesthesia Post-op Note  Patient: Randy [name]  Procedure(s) Performed: Procedure(s) (LRB): REMOVAL AND REPLACMENT PENILE PROTHESIS INFLATABLE COLOPLAST INFRAPUBIC APPROACH (N/A)  Patient Location: PACU  Anesthesia Type: General  Level of Consciousness: awake and alert   Airway and Oxygen Therapy: Patient Spontanous Breathing  Post-op Pain: mild  Post-op Assessment: Post-op Vital signs reviewed, Patient's Cardiovascular Status Stable, Respiratory Function Stable, Patent Airway and No signs of Nausea or vomiting  Last Vitals:  Filed Vitals:   03/04/15 0940  BP: 109/77  Pulse: 98  Temp: 37.1 C  Resp: 18    Post-op Vital Signs: stable   Complications: No apparent anesthesia complications

## 2015-03-04 NOTE — Transfer of Care (Signed)
Immediate Anesthesia Transfer of Care Note  Patient: Randy [name]  Procedure(s) Performed: Procedure(s) (LRB): REMOVAL AND REPLACMENT PENILE PROTHESIS INFLATABLE COLOPLAST INFRAPUBIC APPROACH (N/A)  Patient Location: PACU  Anesthesia Type: General  Level of Consciousness: awake, sedated, patient cooperative and responds to stimulation  Airway & Oxygen Therapy: Patient Spontanous Breathing and Patient connected to face mask oxygen- O/A inserted, HOB elevated responsive, shallow respirations SaO2 86 5- 90 % - FM 15 liters SaO2 increased to 94% - sleepy.   Post-op Assessment: Report given to PACU RN, Post -op Vital signs reviewed and stable and Patient moving all extremities  Post vital signs: Reviewed and stable  Complications: No apparent anesthesia complications

## 2015-03-04 NOTE — Progress Notes (Addendum)
Spoke with Pam RN regarding the patient's elevated SCr of 1.7. The patient has a history of renal dysfunction but there is no other SCr results available for review in EPIC. She stated that Dr. Karsten Ro is aware of this and wants to proceed with the one-time gentamicin. Will dose at 5mg /kg using adjusted body weight.  Romeo Rabon, PharmD, pager [phone]. 03/04/2015,10:08 AM.

## 2015-03-07 ENCOUNTER — Encounter (HOSPITAL_BASED_OUTPATIENT_CLINIC_OR_DEPARTMENT_OTHER): Payer: Self-pay | Admitting: Urology

## 2016-03-02 ENCOUNTER — Other Ambulatory Visit: Payer: Self-pay | Admitting: Urology

## 2016-04-04 NOTE — Patient Instructions (Addendum)
Randy [name]  04/04/2016   Your procedure is scheduled on: 04-13-16  Report to [facility] Main  Entrance take [facility]  elevators to 3rd floor to  [place] at 720 AM.  Call this number if you have problems the morning of surgery [phone]   Remember: ONLY 1 PERSON MAY GO WITH YOU TO SHORT STAY TO GET  READY MORNING OF [place].  Do not eat food or drink liquids :After Midnight.FOLLOW ALL CLEAR LIQUIDS AND BOWEL PRE INSTRUCTIONS FROM DR Alinda Money  BRING CPAP MASK AND TUBING   Take these medicines the morning of surgery with A SIP OF WATER: FINASTERIDE (PROASCAR), GABAPENTIN(NEURONTIN), OXYTUTYNIN (DITROPAN), TAKE 1/2 DOSE PM 70/30 INSULIN ON 04-10-16                              You may not have any metal on your body including hair pins and              piercings  Do not wear jewelry, make-up, lotions, powders or perfumes, deodorant             Do not wear nail polish.  Do not shave  48 hours prior to surgery.              Men may shave face and neck.   Do not bring valuables to the hospital. [place].  Contacts, dentures or bridgework may not be worn into surgery.  Leave suitcase in the car. After surgery it may be brought to your room.     Patients discharged the day of surgery will not be allowed to drive home.  Name and phone number of your driver:  Special Instructions: N/A              Please read over the following fact sheets you were given: _____________________________________________________________________             [facility] - Preparing for Surgery Before surgery, you can play an important role.  Because skin is not sterile, your skin needs to be as free of germs as possible.  You can reduce the number of germs on your skin by washing with CHG (chlorahexidine gluconate) soap before surgery.  CHG is an antiseptic cleaner which kills germs and bonds with the skin to continue killing  germs even after washing. Please DO NOT use if you have an allergy to CHG or antibacterial soaps.  If your skin becomes reddened/irritated stop using the CHG and inform your nurse when you arrive at Short Stay. Do not shave (including legs and underarms) for at least 48 hours prior to the first CHG shower.  You may shave your face/neck. Please follow these instructions carefully:  1.  Shower with CHG Soap the night before surgery and the  morning of Surgery.  2.  If you choose to wash your hair, wash your hair first as usual with your  normal  shampoo.  3.  After you shampoo, rinse your hair and body thoroughly to remove the  shampoo.                           4.  Use CHG as you would any other liquid soap.  You can apply chg directly  to the skin and wash                       Gently with a scrungie or clean washcloth.  5.  Apply the CHG Soap to your body ONLY FROM THE NECK DOWN.   Do not use on face/ open                           Wound or open sores. Avoid contact with eyes, ears mouth and genitals (private parts).                       Wash face,  Genitals (private parts) with your normal soap.             6.  Wash thoroughly, paying special attention to the area where your surgery  will be performed.  7.  Thoroughly rinse your body with warm water from the neck down.  8.  DO NOT shower/wash with your normal soap after using and rinsing off  the CHG Soap.                9.  Pat yourself dry with a clean towel.            10.  Wear clean pajamas.            11.  Place clean sheets on your bed the night of your first shower and do not  sleep with pets. Day of Surgery : Do not apply any lotions/deodorants the morning of surgery.  Please wear clean clothes to the hospital/surgery center.  FAILURE TO FOLLOW THESE INSTRUCTIONS MAY RESULT IN THE CANCELLATION OF YOUR SURGERY PATIENT SIGNATURE_________________________________  NURSE  SIGNATURE__________________________________  ________________________________________________________________________  WHAT IS A BLOOD TRANSFUSION? Blood Transfusion Information  A transfusion is the replacement of blood or some of its parts. Blood is made up of multiple cells which provide different functions.  Red blood cells carry oxygen and are used for blood loss replacement.  White blood cells fight against infection.  Platelets control bleeding.  Plasma helps clot blood.  Other blood products are available for specialized needs, such as hemophilia or other clotting disorders. BEFORE THE TRANSFUSION  Who gives blood for transfusions?   Healthy volunteers who are fully evaluated to make sure their blood is safe. This is blood bank blood. Transfusion therapy is the safest it has ever been in the practice of medicine. Before blood is taken from a donor, a complete history is taken to make sure that person has no history of diseases nor engages in risky social behavior (examples are intravenous drug use or sexual activity with multiple partners). The donor's travel history is screened to minimize risk of transmitting infections, such as malaria. The donated blood is tested for signs of infectious diseases, such as HIV and hepatitis. The blood is then tested to be sure it is compatible with you in order to minimize the chance of a transfusion reaction. If you or a relative donates blood, this is often done in anticipation of surgery and is not appropriate for emergency situations. It takes many days to process the donated blood. RISKS AND COMPLICATIONS Although transfusion therapy is very safe and saves many lives, the main dangers of transfusion include:   Getting an infectious disease.  Developing a transfusion reaction. This is an allergic reaction to something in the blood you were given.  Every precaution is taken to prevent this. The decision to have a blood transfusion has been  considered carefully by your caregiver before blood is given. Blood is not given unless the benefits outweigh the risks. AFTER THE TRANSFUSION  Right after receiving a blood transfusion, you will usually feel much better and more energetic. This is especially true if your red blood cells have gotten low (anemic). The transfusion raises the level of the red blood cells which carry oxygen, and this usually causes an energy increase.  The nurse administering the transfusion will monitor you carefully for complications. HOME CARE INSTRUCTIONS  No special instructions are needed after a transfusion. You may find your energy is better. Speak with your caregiver about any limitations on activity for underlying diseases you may have. SEEK MEDICAL CARE IF:   Your condition is not improving after your transfusion.  You develop redness or irritation at the intravenous (IV) site. SEEK IMMEDIATE MEDICAL CARE IF:  Any of the following symptoms occur over the next 12 hours:  Shaking chills.  You have a temperature by mouth above 102 F (38.9 C), not controlled by medicine.  Chest, back, or muscle pain.  People around you feel you are not acting correctly or are confused.  Shortness of breath or difficulty breathing.  Dizziness and fainting.  You get a rash or develop hives.  You have a decrease in urine output.  Your urine turns a dark color or changes to pink, red, or brown. Any of the following symptoms occur over the next 10 days:  You have a temperature by mouth above 102 F (38.9 C), not controlled by medicine.  Shortness of breath.  Weakness after normal activity.  The white part of the eye turns yellow (jaundice).  You have a decrease in the amount of urine or are urinating less often.  Your urine turns a dark color or changes to pink, red, or brown. Document Released: 08/10/2000 Document Revised: 11/05/2011 Document Reviewed: 03/29/2008 [facility] Patient Information 2014  [place], [place].  _______________________________________________________________________

## 2016-04-09 ENCOUNTER — Ambulatory Visit (HOSPITAL_COMMUNITY)
Admission: RE | Admit: 2016-04-09 | Discharge: 2016-04-09 | Disposition: A | Discharge status: Home / Self Care | Payer: No Typology Code available for payment source | Source: Ambulatory Visit | Attending: Anesthesiology | Admitting: Anesthesiology

## 2016-04-09 ENCOUNTER — Encounter (HOSPITAL_COMMUNITY): Payer: Self-pay

## 2016-04-09 ENCOUNTER — Encounter (HOSPITAL_COMMUNITY)
Admission: RE | Admit: 2016-04-09 | Discharge: 2016-04-09 | Disposition: A | Discharge status: Home / Self Care | Payer: No Typology Code available for payment source | Source: Ambulatory Visit | Attending: Urology | Admitting: Urology

## 2016-04-09 DIAGNOSIS — R05 Cough: Secondary | ICD-10-CM

## 2016-04-09 DIAGNOSIS — R059 Cough, unspecified: Secondary | ICD-10-CM

## 2016-04-09 DIAGNOSIS — Z01818 Encounter for other preprocedural examination: Secondary | ICD-10-CM | POA: Insufficient documentation

## 2016-04-09 HISTORY — DX: Cough, unspecified: R05.9

## 2016-04-09 HISTORY — DX: Failed or difficult intubation, initial encounter: T88.4XXA

## 2016-04-09 HISTORY — DX: Pneumonia, unspecified organism: J18.9

## 2016-04-09 HISTORY — DX: Cough: R05

## 2016-04-09 HISTORY — DX: Malignant (primary) neoplasm, unspecified: C80.1

## 2016-04-09 LAB — BASIC METABOLIC PANEL
Anion gap: 5 (ref 5–15)
BUN: 13 mg/dL (ref 6–20)
CALCIUM: 9.2 mg/dL (ref 8.9–10.3)
CHLORIDE: 104 mmol/L (ref 101–111)
CO2: 29 mmol/L (ref 22–32)
CREATININE: 1.74 mg/dL — AB (ref 0.61–1.24)
GFR calc non Af Amer: 38 mL/min — ABNORMAL LOW (ref >60)
GFR, EST AFRICAN AMERICAN: 44 mL/min — AB (ref >60)
Glucose, Bld: 204 mg/dL — ABNORMAL HIGH (ref 65–99)
Potassium: 4.6 mmol/L (ref 3.5–5.1)
SODIUM: 138 mmol/L (ref 135–145)

## 2016-04-09 LAB — CBC
HCT: 42.9 % (ref 39.0–52.0)
Hemoglobin: 14.3 g/dL (ref 13.0–17.0)
MCH: 28.5 pg (ref 26.0–34.0)
MCHC: 33.3 g/dL (ref 30.0–36.0)
MCV: 85.5 fL (ref 78.0–100.0)
PLATELETS: 167 10*3/uL (ref 150–400)
RBC: 5.02 MIL/uL (ref 4.22–5.81)
RDW: 14.8 % (ref 11.5–15.5)
WBC: 8.3 10*3/uL (ref 4.0–10.5)

## 2016-04-09 LAB — ABO/RH: ABO/RH(D): B POS

## 2016-04-10 LAB — URINE CULTURE: Culture: <10000 — AB

## 2016-04-10 LAB — HEMOGLOBIN A1C
Hgb A1c MFr Bld: 7.4 % — ABNORMAL HIGH (ref 4.8–5.6)
Mean Plasma Glucose: 166 mg/dL

## 2016-04-10 NOTE — Progress Notes (Signed)
Urine culture results faxed by epic to dr Louis Meckel

## 2016-04-11 ENCOUNTER — Encounter (HOSPITAL_COMMUNITY): Payer: Self-pay

## 2016-04-11 ENCOUNTER — Inpatient Hospital Stay (HOSPITAL_COMMUNITY): Payer: No Typology Code available for payment source | Admitting: Certified Registered Nurse Anesthetist

## 2016-04-11 ENCOUNTER — Inpatient Hospital Stay (HOSPITAL_COMMUNITY)
Admission: RE | Admit: 2016-04-11 | Discharge: 2016-04-13 | DRG: 708 | Disposition: A | Discharge status: Home / Self Care | Payer: No Typology Code available for payment source | Source: Ambulatory Visit | Attending: Urology | Admitting: Urology

## 2016-04-11 ENCOUNTER — Encounter (HOSPITAL_COMMUNITY)
Admission: RE | Disposition: A | Discharge status: Home / Self Care | Payer: Self-pay | Source: Ambulatory Visit | Attending: Urology

## 2016-04-11 DIAGNOSIS — Z79899 Other long term (current) drug therapy: Secondary | ICD-10-CM

## 2016-04-11 DIAGNOSIS — N529 Male erectile dysfunction, unspecified: Secondary | ICD-10-CM | POA: Diagnosis present

## 2016-04-11 DIAGNOSIS — C61 Malignant neoplasm of prostate: Principal | ICD-10-CM | POA: Diagnosis present

## 2016-04-11 DIAGNOSIS — Z8042 Family history of malignant neoplasm of prostate: Secondary | ICD-10-CM | POA: Diagnosis not present

## 2016-04-11 DIAGNOSIS — E875 Hyperkalemia: Secondary | ICD-10-CM | POA: Diagnosis present

## 2016-04-11 DIAGNOSIS — Z791 Long term (current) use of non-steroidal anti-inflammatories (NSAID): Secondary | ICD-10-CM | POA: Diagnosis not present

## 2016-04-11 DIAGNOSIS — Z87891 Personal history of nicotine dependence: Secondary | ICD-10-CM | POA: Diagnosis not present

## 2016-04-11 DIAGNOSIS — Z794 Long term (current) use of insulin: Secondary | ICD-10-CM

## 2016-04-11 HISTORY — PX: ROBOT ASSISTED LAPAROSCOPIC RADICAL PROSTATECTOMY: SHX5141

## 2016-04-11 LAB — HEMOGLOBIN AND HEMATOCRIT, BLOOD
HCT: 41.2 % (ref 39.0–52.0)
HEMOGLOBIN: 13.8 g/dL (ref 13.0–17.0)

## 2016-04-11 LAB — BASIC METABOLIC PANEL
Anion gap: 9 (ref 5–15)
BUN: 11 mg/dL (ref 6–20)
CALCIUM: 8.4 mg/dL — AB (ref 8.9–10.3)
CHLORIDE: 104 mmol/L (ref 101–111)
CO2: 23 mmol/L (ref 22–32)
Creatinine, Ser: 1.62 mg/dL — ABNORMAL HIGH (ref 0.61–1.24)
GFR calc non Af Amer: 42 mL/min — ABNORMAL LOW (ref >60)
GFR, EST AFRICAN AMERICAN: 48 mL/min — AB (ref >60)
Glucose, Bld: 173 mg/dL — ABNORMAL HIGH (ref 65–99)
Potassium: 3.8 mmol/L (ref 3.5–5.1)
Sodium: 136 mmol/L (ref 135–145)

## 2016-04-11 LAB — GLUCOSE, CAPILLARY
GLUCOSE-CAPILLARY: 70 mg/dL (ref 65–99)
GLUCOSE-CAPILLARY: 128 mg/dL — AB (ref 65–99)
GLUCOSE-CAPILLARY: 135 mg/dL — AB (ref 65–99)
GLUCOSE-CAPILLARY: 221 mg/dL — AB (ref 65–99)
Glucose-Capillary: 66 mg/dL (ref 65–99)
Glucose-Capillary: 154 mg/dL — ABNORMAL HIGH (ref 65–99)
Glucose-Capillary: 188 mg/dL — ABNORMAL HIGH (ref 65–99)
Glucose-Capillary: 242 mg/dL — ABNORMAL HIGH (ref 65–99)

## 2016-04-11 LAB — TYPE AND SCREEN
ABO/RH(D): B POS
Antibody Screen: NEGATIVE

## 2016-04-11 SURGERY — PROSTATECTOMY, RADICAL, ROBOT-ASSISTED, LAPAROSCOPIC
Anesthesia: General

## 2016-04-11 MED ORDER — EPHEDRINE SULFATE 50 MG/ML IJ SOLN
INTRAMUSCULAR | Status: DC | PRN
  Administered 2016-04-11 (×2): 10 mg via INTRAVENOUS

## 2016-04-11 MED ORDER — SODIUM CHLORIDE 0.9 % IJ SOLN
INTRAMUSCULAR | Status: AC
  Filled 2016-04-11: qty 20

## 2016-04-11 MED ORDER — CIPROFLOXACIN IN D5W 400 MG/200ML IV SOLN
INTRAVENOUS | Status: AC
  Filled 2016-04-11: qty 200

## 2016-04-11 MED ORDER — POLYETHYLENE GLYCOL 3350 17 G PO PACK
17.0000 g | PACK | Freq: Every day | ORAL | Status: DC
  Administered 2016-04-11 – 2016-04-12 (×2): 17 g via ORAL
  Filled 2016-04-11 (×2): qty 1

## 2016-04-11 MED ORDER — MIDAZOLAM HCL 2 MG/2ML IJ SOLN
INTRAMUSCULAR | Status: AC
  Filled 2016-04-11: qty 2

## 2016-04-11 MED ORDER — ACETAMINOPHEN 10 MG/ML IV SOLN
1000.0000 mg | Freq: Four times a day (QID) | INTRAVENOUS | Status: AC
  Administered 2016-04-11 – 2016-04-12 (×4): 1000 mg via INTRAVENOUS
  Filled 2016-04-11 (×4): qty 100

## 2016-04-11 MED ORDER — GABAPENTIN 300 MG PO CAPS
300.0000 mg | ORAL_CAPSULE | Freq: Two times a day (BID) | ORAL | Status: DC
  Administered 2016-04-11 – 2016-04-13 (×4): 300 mg via ORAL
  Filled 2016-04-11 (×4): qty 1

## 2016-04-11 MED ORDER — FENTANYL CITRATE (PF) 100 MCG/2ML IJ SOLN
INTRAMUSCULAR | Status: AC
  Filled 2016-04-11: qty 2

## 2016-04-11 MED ORDER — ATORVASTATIN CALCIUM 10 MG PO TABS
20.0000 mg | ORAL_TABLET | Freq: Every day | ORAL | Status: DC
  Administered 2016-04-11 – 2016-04-12 (×2): 20 mg via ORAL
  Filled 2016-04-11 (×2): qty 2

## 2016-04-11 MED ORDER — FENTANYL CITRATE (PF) 250 MCG/5ML IJ SOLN
INTRAMUSCULAR | Status: AC
  Filled 2016-04-11: qty 5

## 2016-04-11 MED ORDER — POLYETHYLENE GLYCOL 3350 17 GM/SCOOP PO POWD
17.0000 g | Freq: Every day | ORAL | Status: DC
  Filled 2016-04-11: qty 255

## 2016-04-11 MED ORDER — EPHEDRINE SULFATE 50 MG/ML IJ SOLN
INTRAMUSCULAR | Status: AC
  Filled 2016-04-11: qty 1

## 2016-04-11 MED ORDER — TRAMADOL HCL 50 MG PO TABS: 100.0000 mg | ORAL_TABLET | Freq: Four times a day (QID) | ORAL | 0 refills | Status: DC | PRN

## 2016-04-11 MED ORDER — SODIUM CHLORIDE 0.9 % IV BOLUS (SEPSIS)
500.0000 mL | Freq: Once | INTRAVENOUS | Status: AC
  Administered 2016-04-11: 500 mL via INTRAVENOUS

## 2016-04-11 MED ORDER — DEXAMETHASONE SODIUM PHOSPHATE 10 MG/ML IJ SOLN
INTRAMUSCULAR | Status: AC
  Filled 2016-04-11: qty 1

## 2016-04-11 MED ORDER — ONDANSETRON HCL 4 MG/2ML IJ SOLN
4.0000 mg | INTRAMUSCULAR | Status: DC | PRN
  Filled 2016-04-11: qty 2

## 2016-04-11 MED ORDER — SUGAMMADEX SODIUM 200 MG/2ML IV SOLN
INTRAVENOUS | Status: DC | PRN
  Administered 2016-04-11: 20 mg via INTRAVENOUS

## 2016-04-11 MED ORDER — LACTATED RINGERS IV SOLN
INTRAVENOUS | Status: DC | PRN
  Administered 2016-04-11 (×2): via INTRAVENOUS

## 2016-04-11 MED ORDER — CIPROFLOXACIN IN D5W 400 MG/200ML IV SOLN
400.0000 mg | INTRAVENOUS | Status: AC
  Administered 2016-04-11: 400 mg via INTRAVENOUS

## 2016-04-11 MED ORDER — SUCCINYLCHOLINE CHLORIDE 20 MG/ML IJ SOLN
INTRAMUSCULAR | Status: DC | PRN
  Administered 2016-04-11: 100 mg via INTRAVENOUS

## 2016-04-11 MED ORDER — PHENYLEPHRINE HCL 10 MG/ML IJ SOLN
INTRAMUSCULAR | Status: AC
  Filled 2016-04-11: qty 1

## 2016-04-11 MED ORDER — KETOROLAC TROMETHAMINE 15 MG/ML IJ SOLN
15.0000 mg | Freq: Four times a day (QID) | INTRAMUSCULAR | Status: DC
  Administered 2016-04-11 – 2016-04-12 (×3): 15 mg via INTRAVENOUS
  Filled 2016-04-11 (×3): qty 1

## 2016-04-11 MED ORDER — HYDROMORPHONE HCL 1 MG/ML IJ SOLN
0.2500 mg | INTRAMUSCULAR | Status: DC | PRN
  Administered 2016-04-11 (×4): 0.5 mg via INTRAVENOUS

## 2016-04-11 MED ORDER — MIDAZOLAM HCL 5 MG/5ML IJ SOLN
INTRAMUSCULAR | Status: DC | PRN
  Administered 2016-04-11 (×2): 1 mg via INTRAVENOUS

## 2016-04-11 MED ORDER — PROMETHAZINE HCL 25 MG/ML IJ SOLN: 6.2500 mg | INTRAMUSCULAR | Status: DC | PRN

## 2016-04-11 MED ORDER — ALLOPURINOL 100 MG PO TABS
100.0000 mg | ORAL_TABLET | Freq: Every day | ORAL | Status: DC
  Administered 2016-04-11 – 2016-04-12 (×2): 100 mg via ORAL
  Filled 2016-04-11 (×2): qty 1

## 2016-04-11 MED ORDER — BUPIVACAINE-EPINEPHRINE 0.25% -1:200000 IJ SOLN
INTRAMUSCULAR | Status: DC | PRN
  Administered 2016-04-11: 15 mL

## 2016-04-11 MED ORDER — LIDOCAINE HCL (CARDIAC) 20 MG/ML IV SOLN
INTRAVENOUS | Status: DC | PRN
Start: 2016-04-11 — End: 2016-04-11
  Administered 2016-04-11: 100 mg via INTRAVENOUS

## 2016-04-11 MED ORDER — SULFAMETHOXAZOLE-TRIMETHOPRIM 800-160 MG PO TABS: 1.0000 | ORAL_TABLET | Freq: Two times a day (BID) | ORAL | 0 refills | Status: AC

## 2016-04-11 MED ORDER — SUGAMMADEX SODIUM 200 MG/2ML IV SOLN
INTRAVENOUS | Status: AC
  Filled 2016-04-11: qty 2

## 2016-04-11 MED ORDER — SODIUM CHLORIDE 0.9 % IJ SOLN
INTRAMUSCULAR | Status: AC
  Filled 2016-04-11: qty 10

## 2016-04-11 MED ORDER — PROPOFOL 10 MG/ML IV BOLUS
INTRAVENOUS | Status: DC | PRN
  Administered 2016-04-11: 150 mg via INTRAVENOUS

## 2016-04-11 MED ORDER — INSULIN ASPART 100 UNIT/ML ~~LOC~~ SOLN
0.0000 [IU] | Freq: Three times a day (TID) | SUBCUTANEOUS | Status: DC
  Administered 2016-04-11: 5 [IU] via SUBCUTANEOUS
  Administered 2016-04-12 (×2): 3 [IU] via SUBCUTANEOUS
  Administered 2016-04-12 – 2016-04-13 (×2): 2 [IU] via SUBCUTANEOUS
  Administered 2016-04-13: 3 [IU] via SUBCUTANEOUS

## 2016-04-11 MED ORDER — HYDROMORPHONE HCL 1 MG/ML IJ SOLN
INTRAMUSCULAR | Status: AC
  Filled 2016-04-11: qty 1

## 2016-04-11 MED ORDER — ROCURONIUM BROMIDE 100 MG/10ML IV SOLN
INTRAVENOUS | Status: DC | PRN
  Administered 2016-04-11: 40 mg via INTRAVENOUS
  Administered 2016-04-11: 10 mg via INTRAVENOUS
  Administered 2016-04-11: 5 mg via INTRAVENOUS
  Administered 2016-04-11: 20 mg via INTRAVENOUS
  Administered 2016-04-11: 10 mg via INTRAVENOUS

## 2016-04-11 MED ORDER — FENTANYL CITRATE (PF) 100 MCG/2ML IJ SOLN
INTRAMUSCULAR | Status: DC | PRN
  Administered 2016-04-11 (×6): 50 ug via INTRAVENOUS

## 2016-04-11 MED ORDER — PROPOFOL 10 MG/ML IV BOLUS
INTRAVENOUS | Status: AC
  Filled 2016-04-11: qty 20

## 2016-04-11 MED ORDER — BUPIVACAINE-EPINEPHRINE (PF) 0.25% -1:200000 IJ SOLN
INTRAMUSCULAR | Status: AC
  Filled 2016-04-11: qty 30

## 2016-04-11 MED ORDER — LIDOCAINE HCL (CARDIAC) 20 MG/ML IV SOLN
INTRAVENOUS | Status: AC
  Filled 2016-04-11: qty 5

## 2016-04-11 MED ORDER — ROCURONIUM BROMIDE 100 MG/10ML IV SOLN
INTRAVENOUS | Status: AC
  Filled 2016-04-11: qty 1

## 2016-04-11 MED ORDER — BUPIVACAINE LIPOSOME 1.3 % IJ SUSP
INTRAMUSCULAR | Status: DC | PRN
  Administered 2016-04-11: 20 mL

## 2016-04-11 MED ORDER — BUPIVACAINE LIPOSOME 1.3 % IJ SUSP
INTRAMUSCULAR | Status: AC
  Filled 2016-04-11: qty 20

## 2016-04-11 MED ORDER — OXYBUTYNIN CHLORIDE 5 MG PO TABS
5.0000 mg | ORAL_TABLET | Freq: Two times a day (BID) | ORAL | Status: DC
  Administered 2016-04-11 – 2016-04-13 (×4): 5 mg via ORAL
  Filled 2016-04-11 (×4): qty 1

## 2016-04-11 MED ORDER — LISINOPRIL 20 MG PO TABS
20.0000 mg | ORAL_TABLET | Freq: Every day | ORAL | Status: DC
  Administered 2016-04-11: 20 mg via ORAL
  Filled 2016-04-11: qty 1

## 2016-04-11 MED ORDER — PHENYLEPHRINE HCL 10 MG/ML IJ SOLN
INTRAMUSCULAR | Status: DC | PRN
  Administered 2016-04-11: 80 ug via INTRAVENOUS

## 2016-04-11 MED ORDER — SODIUM CHLORIDE 0.9 % IV SOLN
INTRAVENOUS | Status: DC | PRN
  Administered 2016-04-11: 50 ug/min via INTRAVENOUS

## 2016-04-11 MED ORDER — PHENYLEPHRINE 40 MCG/ML (10ML) SYRINGE FOR IV PUSH (FOR BLOOD PRESSURE SUPPORT)
PREFILLED_SYRINGE | INTRAVENOUS | Status: AC
  Filled 2016-04-11: qty 10

## 2016-04-11 MED ORDER — INSULIN ASPART 100 UNIT/ML ~~LOC~~ SOLN
0.0000 [IU] | Freq: Every day | SUBCUTANEOUS | Status: DC
  Administered 2016-04-11: 2 [IU] via SUBCUTANEOUS

## 2016-04-11 MED ORDER — LACTATED RINGERS IR SOLN
Status: DC | PRN
  Administered 2016-04-11: 1000 mL

## 2016-04-11 MED ORDER — FUROSEMIDE 20 MG PO TABS: 20.0000 mg | ORAL_TABLET | Freq: Every day | ORAL | Status: DC

## 2016-04-11 MED ORDER — DEXAMETHASONE SODIUM PHOSPHATE 10 MG/ML IJ SOLN
INTRAMUSCULAR | Status: DC | PRN
  Administered 2016-04-11: 10 mg via INTRAVENOUS

## 2016-04-11 MED ORDER — LACTATED RINGERS IV SOLN
INTRAVENOUS | Status: DC
  Administered 2016-04-11 – 2016-04-12 (×2): via INTRAVENOUS

## 2016-04-11 MED ORDER — CEFAZOLIN SODIUM-DEXTROSE 2-4 GM/100ML-% IV SOLN
INTRAVENOUS | Status: AC
  Filled 2016-04-11: qty 100

## 2016-04-11 MED ORDER — ONDANSETRON HCL 4 MG/2ML IJ SOLN
INTRAMUSCULAR | Status: DC | PRN
  Administered 2016-04-11: 4 mg via INTRAVENOUS

## 2016-04-11 MED ORDER — HYDROMORPHONE HCL 1 MG/ML IJ SOLN
0.5000 mg | INTRAMUSCULAR | Status: DC | PRN
  Administered 2016-04-11: 1 mg via INTRAVENOUS
  Filled 2016-04-11: qty 1

## 2016-04-11 MED ORDER — ONDANSETRON HCL 4 MG/2ML IJ SOLN
INTRAMUSCULAR | Status: AC
  Filled 2016-04-11: qty 2

## 2016-04-11 MED ORDER — CEFAZOLIN SODIUM-DEXTROSE 2-4 GM/100ML-% IV SOLN
2.0000 g | INTRAVENOUS | Status: AC
Start: 2016-04-11 — End: 2016-04-11
  Administered 2016-04-11: 2 g via INTRAVENOUS
  Filled 2016-04-11: qty 100

## 2016-04-11 MED ORDER — CEFAZOLIN SODIUM-DEXTROSE 2-4 GM/100ML-% IV SOLN
2.0000 g | Freq: Three times a day (TID) | INTRAVENOUS | Status: AC
  Administered 2016-04-11 – 2016-04-12 (×3): 2 g via INTRAVENOUS
  Filled 2016-04-11 (×3): qty 100

## 2016-04-11 SURGICAL SUPPLY — 45 items
CATH FOLEY 2WAY SLVR 18FR 30CC (CATHETERS) ×2 IMPLANT
CATH TIEMANN FOLEY 18FR 5CC (CATHETERS) ×2 IMPLANT
CHLORAPREP W/TINT 26ML (MISCELLANEOUS) ×2 IMPLANT
CLIP LIGATING HEM O LOK PURPLE (MISCELLANEOUS) ×4 IMPLANT
COVER SURGICAL LIGHT HANDLE (MISCELLANEOUS) ×2 IMPLANT
COVER TIP SHEARS 8 DVNC (MISCELLANEOUS) ×1 IMPLANT
COVER TIP SHEARS 8MM DA VINCI (MISCELLANEOUS) ×1
CUTTER ECHEON FLEX ENDO 45 340 (ENDOMECHANICALS) ×2 IMPLANT
DECANTER SPIKE VIAL GLASS SM (MISCELLANEOUS) ×1 IMPLANT
DRAPE ARM DVNC X/XI (DISPOSABLE) ×4 IMPLANT
DRAPE COLUMN DVNC XI (DISPOSABLE) ×1 IMPLANT
DRAPE DA VINCI XI ARM (DISPOSABLE) ×4
DRAPE DA VINCI XI COLUMN (DISPOSABLE) ×1
DRAPE SURG IRRIG POUCH 19X23 (DRAPES) ×2 IMPLANT
DRSG TEGADERM 4X4.75 (GAUZE/BANDAGES/DRESSINGS) ×2 IMPLANT
ELECT REM PT RETURN 9FT ADLT (ELECTROSURGICAL) ×2
ELECTRODE REM PT RTRN 9FT ADLT (ELECTROSURGICAL) ×1 IMPLANT
GAUZE SPONGE 2X2 8PLY STRL LF (GAUZE/BANDAGES/DRESSINGS) IMPLANT
GLOVE BIO SURGEON STRL SZ 6.5 (GLOVE) ×2 IMPLANT
GLOVE BIOGEL M STRL SZ7.5 (GLOVE) ×4 IMPLANT
GOWN STRL REUS W/TWL LRG LVL3 (GOWN DISPOSABLE) ×2 IMPLANT
GOWN STRL REUS W/TWL XL LVL3 (GOWN DISPOSABLE) ×4 IMPLANT
HOLDER FOLEY CATH W/STRAP (MISCELLANEOUS) ×2 IMPLANT
IRRIG SUCT STRYKERFLOW 2 WTIP (MISCELLANEOUS) ×2
IRRIGATION SUCT STRKRFLW 2 WTP (MISCELLANEOUS) ×1 IMPLANT
IV LACTATED RINGERS 1000ML (IV SOLUTION) ×1 IMPLANT
LIQUID BAND (GAUZE/BANDAGES/DRESSINGS) ×2 IMPLANT
PACK ROBOT UROLOGY CUSTOM (CUSTOM PROCEDURE TRAY) ×2 IMPLANT
PAD POSITIONING PINK XL (MISCELLANEOUS) ×1 IMPLANT
RELOAD GREEN ECHELON 45 (STAPLE) ×3 IMPLANT
SEAL CANN UNIV 5-8 DVNC XI (MISCELLANEOUS) ×4 IMPLANT
SEAL XI 5MM-8MM UNIVERSAL (MISCELLANEOUS) ×4
SOLUTION ELECTROLUBE (MISCELLANEOUS) ×2 IMPLANT
SPONGE GAUZE 2X2 STER 10/PKG (GAUZE/BANDAGES/DRESSINGS)
SUT ETHILON 3 0 PS 1 (SUTURE) ×2 IMPLANT
SUT MNCRL AB 4-0 PS2 18 (SUTURE) ×4 IMPLANT
SUT VIC AB 0 CT1 27 (SUTURE) ×2
SUT VIC AB 0 CT1 27XBRD ANTBC (SUTURE) ×1 IMPLANT
SUT VIC AB 2-0 SH 27 (SUTURE) ×2
SUT VIC AB 2-0 SH 27X BRD (SUTURE) ×1 IMPLANT
SUT VICRYL 0 UR6 27IN ABS (SUTURE) ×4 IMPLANT
SUT VLOC BARB 180 ABS3/0GR12 (SUTURE) ×4
SUTURE VLOC BRB 180 ABS3/0GR12 (SUTURE) ×2 IMPLANT
TOWEL OR NON WOVEN STRL DISP B (DISPOSABLE) ×2 IMPLANT
WATER STERILE IRR 1500ML POUR (IV SOLUTION) ×1 IMPLANT

## 2016-04-11 NOTE — Anesthesia Preprocedure Evaluation (Addendum)
Anesthesia Evaluation  Patient identified by MRN, date of birth, ID band  Reviewed: Allergy & Precautions, NPO status , Patient's Chart, lab work & pertinent test results  History of Anesthesia Complications (+) DIFFICULT AIRWAY and history of anesthetic complications  Airway Mallampati: I  TM Distance: >3 FB Neck ROM: Limited    Dental  (+) Poor Dentition, Missing, Dental Advisory Given   Pulmonary sleep apnea , Current Smoker,    breath sounds clear to auscultation       Cardiovascular hypertension,  Rhythm:Regular Rate:Normal     Neuro/Psych Hx of cerv myelopathy  Neuromuscular disease    GI/Hepatic GERD  ,  Endo/Other  diabetes  Renal/GU      Musculoskeletal  (+) Arthritis ,   Abdominal (+) + obese,   Peds  Hematology   Anesthesia Other Findings   Reproductive/Obstetrics                            Anesthesia Physical Anesthesia Plan  ASA: III  Anesthesia Plan: General   Post-op Pain Management:    Induction: Intravenous  Airway Management Planned: Oral ETT and Video Laryngoscope Planned  Additional Equipment:   Intra-op Plan:   Post-operative Plan: Extubation in OR  Informed Consent: I have reviewed the patients History and Physical, chart, labs and discussed the procedure including the risks, benefits and alternatives for the proposed anesthesia with the patient or authorized representative who has indicated his/her understanding and acceptance.   Dental advisory given  Plan Discussed with:   Anesthesia Plan Comments:        Anesthesia Quick Evaluation

## 2016-04-11 NOTE — H&P (Signed)
Patient of Dr. Karsten Ro referred to discuss Robotic-assisted laparoscopic radical prostatectomy. Patient had undergone TRUS/BX due to an rising PSA and nodularity in right lobe of his prostate.   The patient presents today for further discussion of his prostate cancer and treatment options. He has undergone a discussion with Dr. Karsten Ro in regards to management optionsFor his prostate cancer. The patient has opted for surgical extirpation.   The patient has a penile prosthesis for erectile dysfunction. This was a result of a priapism that he had approximately 20 years ago.   The patient states that he has some urinary incontinence from overactive bladder symptoms. He takes terazosin for symptoms which has controlled/Stabilize his symptoms.   The patient is a diabetic with good glucose control. He has had neck surgery twice as well as a penile prosthesis implant. He had no palpitations with these operations from an anesthetic perspective. He denies a history of heart attack or stroke. He has good exercise tolerance. He has no history of intra-abdominal operations.     ALLERGIES:  No Allergies No Known Drug Allergies    MEDICATIONS: Allopurinol 100 MG Oral Tablet Oral  Atorvastatin Calcium 40 MG Oral Tablet Oral  Clotrimazole 1 % External Cream External  Erythromycin SOLN External  Finasteride 5 MG Oral Tablet Oral  FLUoxetine HCl - 20 MG Oral Capsule Oral  HydroCHLOROthiazide 25 MG Oral Tablet Oral  Ibuprofen 800 MG Oral Tablet Oral  Lactulose SOLN Oral  LevoFLOXacin 500 MG Oral Tablet 0 Oral  Lisinopril 40 MG Oral Tablet Oral  Loratadine 10 MG Oral Tablet Oral  Meloxicam 15 MG Oral Tablet Oral  Metoprolol Tartrate 50 MG Oral Tablet Oral  Mupirocin 2 % External Ointment External  NovoLIN 70/30 Innolet 70-30 % SUSP Subcutaneous  Omeprazole 40 MG Oral Capsule Delayed Release Oral  Simvastatin 40 MG Oral Tablet Oral  Terazosin HCl - 5 MG Oral Capsule Oral  Terazosin HCl - 5 MG Oral  Capsule Oral  TraZODone HCl - 50 MG Oral Tablet Oral     GU PSH: Repair IPP - 03/14/2015      PSH Notes: Penile Prosthetic Device Inflatable, Surg Penis Replacement Of Inflatable Penile Prosthesis, Neck Surgery   NON-GU PSH: None   GU PMH: Prostate Cancer, Adenocarcinoma of prostate - 12/28/2015 Nodular prostate w/o LUTS, Prostate nodule - 12/21/2015 Other mechanical complication implanted penile prosthesis, initial enc, Malfunction of penile prosthesis - 12/13/2015 Urge incontinence, Urge incontinence of urine - 04/13/2014 BPH w/LUTS, Benign localized prostatic hyperplasia with lower urinary tract symptoms (LUTS) - 02/24/2014 Male ED, unspecified, Erectile dysfunction - 02/24/2014 Personal Hx Oth Urinary System diseases, History of renal failure - 2015      PMH Notes: LUTS: He has had hesitancy as well as urgency and urge incontinence. This had been controlled with terazosin 5 mg.   Current therapy: Terazosin 10 mg   Erectile dysfunction: He underwent inflatable penile prosthesis implantation 18 years ago by Dr. Janice Norrie. It ceased to function but was not causing him any pain. It is not causing any pain.   Treatment: Removal and replacement of the prosthesis on 03/04/15.   Rising PSA: His not have a rising PSA with some nodularity at the base of his prostate on the right hand side.   TRUS/BX 12/21/15: no worrisome hypoechoic lesions within the prostate. Prostate volume - 34 cc.   Pathology: Adenocarcinoma Gleason score 3+3 = 6 in 2 cores from the right lobe (50% and 70%) at the base and mid prostate laterally corresponding to his  mild palpable nodularity.   Stage: T2a   PSAs:  09/29/14 - 2.09  10/21/15 - 2.82  11/30/15 - 3.40     NON-GU PMH: Encounter for general adult medical examination without abnormal findings, Encounter for preventive health examination - 12/21/2015 Personal history of other diseases of the digestive system, History of esophageal reflux - 2015 Personal history of other  diseases of the nervous system and sense organs, History of sleep apnea - 2015 Personal history of other endocrine, nutritional and metabolic disease, History of diabetes mellitus - 2015    FAMILY HISTORY: Prostate Cancer - Runs In Family   SOCIAL HISTORY: None    Notes: Alcohol use, Former smoker, Married, Caffeine use   REVIEW OF SYSTEMS:    GU Review Male:   Patient reports frequent urination, hard to postpone urination, get up at night to urinate, and leakage of urine. Patient denies burning/ pain with urination, stream starts and stops, trouble starting your streams, have to strain to urinate , erection problems, and penile pain.  Gastrointestinal (Upper):   Patient denies nausea, vomiting, and indigestion/ heartburn.  Gastrointestinal (Lower):   Patient denies diarrhea and constipation.  Constitutional:   Patient denies fever, night sweats, weight loss, and fatigue.  Skin:   Patient denies skin rash/ lesion and itching.  Eyes:   Patient denies blurred vision and double vision.  Ears/ Nose/ Throat:   Patient reports sinus problems. Patient denies sore throat.  Hematologic/Lymphatic:   Patient denies easy bruising and swollen glands.  Cardiovascular:   Patient reports leg swelling. Patient denies chest pains.  Respiratory:   Patient reports cough. Patient denies shortness of breath.  Endocrine:   Patient denies excessive thirst.  Musculoskeletal:   Patient denies back pain and joint pain.  Neurological:   Patient denies headaches and dizziness.  Psychologic:   Patient denies depression and anxiety.   VITAL SIGNS:    Weight: 205 lb/93 kg   BP: 105/68 mmHg   Pulse: 94 /min   Temp: 97.8 F / 37 C      MULTI-SYSTEM PHYSICAL EXAMINATION:    Constitutional: Well-nourished. No physical deformities. Normally developed. Good grooming.   Respiratory: No labored breathing, no use of accessory muscles.   Cardiovascular: Normal temperature, normal extremity pulses, no swelling, no  varicosities.   Gastrointestinal: No mass, no tenderness, no rigidity, non obese abdomen.   Musculoskeletal: Normal gait and station of head and neck.   PAST DATA REVIEWED:  Source Of History:  Patient  Lab Test Review:   PSA  Records Review:   Pathology Reports, Previous Patient Records   PROCEDURES:          Urinalysis - 81003 Dipstick Dipstick Cont'd Micro  Specimen: Voided Bilirubin: Neg WBC/hpf: Neg  Color: Straw Ketones: Neg RBC/hpf: 0-2  Appearance: Clear Blood: Trace Intact Bacteria: Neg  Specific Gravity: 1.020 Protein: Neg    pH: 6.0 Nitrites: Neg    Glucose: Neg Leukocyte Esterase: Neg      ASSESSMENT:      ICD-10 Details  1 GU:   Prostate Cancer - C61    PLAN:           Schedule Return Visit: No Further Follow Up - Office Visit  Return Visit: Schedule Surgery  Return Notes: We discussed prostatectomy and specifically robotic prostatectomy with bilateral pelvic lymphadenectomy being the technique that I most commonly perform. I showed the patient on their abdomen the approximately 6 small incision (trocar) sites as well as presumed extraction sites with robotic  approach as well as possible open incision sites should open conversion be necessary. We discussed peri-operative risks including bleeding, infection, deep vein thrombosis, pulmonary embolism, compartment syndrome, nuropathy / neuropraxia, heart attack, stroke, death, as well as long-term risks such as non-cure / need for additional therapy. We specifically addressed that the procedure would compromise urinary control leading to stress incontinence which typically resolves with time and pelvic rehabilitation (Kegel's, etc..), but can sometimes be permanent and require additional therapy including surgery. We also specifically addressed sexual sequellae including significant erectile dysfunction which typically partially resolves with time but can also be permanent and require additional therapy including surgery.    We discussed the typical hospital course including usual 1-2 night hospitalization, discharge with foley catheter in place usually for 1-2 weeks before voiding trial as well as usually 2 week recovery until able to perform most non-strenuous activity and 6 weeks until able to return to most jobs and more strenuous activity such as exercise.

## 2016-04-11 NOTE — Progress Notes (Signed)
cbg- [id number] 

## 2016-04-11 NOTE — Progress Notes (Signed)
Spoke w Dr Orene Desanctis RE patients CBG of 70. He is asymptomatic.Also made him aware he was 48 at 0330 at home and took 1 tube of glucose gel.

## 2016-04-11 NOTE — Transfer of Care (Signed)
Immediate Anesthesia Transfer of Care Note  Patient: Randy [name]  Procedure(s) Performed: Procedure(s): XI ROBOTIC ASSISTED LAPAROSCOPIC RADICAL PROSTATECTOMY (N/A)  Patient Location: PACU  Anesthesia Type:General  Level of Consciousness:  sedated, patient cooperative and responds to stimulation  Airway & Oxygen Therapy:Patient Spontanous Breathing and Patient connected to face mask oxgen  Post-op Assessment:  Report given to PACU RN and Post -op Vital signs reviewed and stable  Post vital signs:  Reviewed and stable  Last Vitals:  Vitals:   04/11/16 0735 04/11/16 1417  BP: 110/79   Pulse: 89   Resp: 18 12  Temp: 123XX123 C     Complications: No apparent anesthesia complications

## 2016-04-11 NOTE — Op Note (Signed)
Preoperative diagnosis:  1. Prostate Cancer   Postoperative diagnosis:  1. same   Procedure: 1. Robotic assisted laparoscopic radical prostatectomy   Surgeon: Ardis Hughs, MD First Assistant: Dr. Virginia Crews, MD  Anesthesia: General  Complications: None  Intraoperative findings: #1 The patient's reservoir for his penile prosthesis was located in the right pelvic region. Caution was taken to dissect carefully around this area and preserve the reservoir ensuring proper function following the prostatectomy. At the end of the case the prosthesis did cycle well. The patient's penis inflated appropriately. #2  the patient had a narrow pelvis making the entire surgery somewhat difficult, the vesicourethral anastomosis was tight at the end.  EBL: 300 mL  Specimens:  #1.  Prostate and seminal vesicals   Indication: Randy [name] is a 70 y.o. patient with low-grade prostate cancer.  After reviewing the management options for treatment, he elected to proceed with the removal of his prostate. We have discussed the potential benefits and risks of the procedure, side effects of the proposed treatment, the likelihood of the patient achieving the goals of the procedure, and any potential problems that might occur during the procedure or recuperation. Informed consent has been obtained.  Description of procedure:  The patient was consented in the preoperative holding area. He is in brought back to the operating room placed the table in supine position. General anesthesia was then induced and endotracheal tube was inserted. He was then placed in dorsolithotomy position and placed in steep Trendelenburg. He was then prepped and draped in the routine sterile fashion. We then began by making a 10 mm incision supraumbilical midline incision the skin down through into the peritoneum. Then placed a 8 mm trocar. I then inflated the abdomen and inserted the 0 robotic lens. We then placed 2 additional  a 8 millimeter trochars in the patient's left lower abdomen proximally 9 cm apart and 2 trochars on the patient's right lower abdomen, one was an 8 mm trocar and the one most lateral was a 12 mm trocar which was used as the assistant port. A 5 mm trocar was placed by triangulating the 2 right lateral ports as a second assistant port. These ports were all placed under visual guidance. Once the ports were noted to be satisfactory position the robot was docked. We started with the 0 lens, monopolar scissors in the right hand and the [place] forceps the left hand as well as a fenestrated grasper as the third arm on the left-hand side.   We began our dissection the posterior plane incising the peritoneum at the level of the vas deferens. Isolated the left vas deferens and dissected it proximally towards the spermatic cord for 5 cm prior to ligating it. Then used this as traction to isolate the left the seminal vesicle which was then undressed bluntly and completely dissected out, all vessels were cauterized with a combination of bipolar and the monopolar scissors. We then turned our attention to the right side and similarly dissected out the right vas deferens and seminal vesicle. Once the SVs had been freed, we turned our attention to the posterior plane and bluntly dissected the tissue between the rectum and the posterior wall of the prostate bluntly out towards the apex.    At this point the bladder was taken down starting at the urachal remnant with a combination of both blunt dissection and sharp dissection using monopolar cautery the bladder was dropped down in the usual fashion to the medial umbilical ligaments laterally and  the dorsal vein of the prostate anteriorly creating our space of Retzius. This dissection began by mobilizing the left umbilical ligament first carefully dissecting across to the penile prosthesis reservoir. We then turned our attention to the endopelvic fascia which was incised  laterally starting on the patient's right-hand side the levator muscles were pushed off the prostate laterally up towards the dorsal vein complex on the right-hand side. This process was then repeated on the left-hand side and a nice notch was created for the dorsal vein. I then used a 29mm stapler to staple the dorsal vein.   We then located the bladder neck at the vesicoprostatic junction and using the monopolar scissors dissected down through the perivesical tissues and the bladder neck down to the prostatic urethra. The catheter was then deflated and pulled through our urethral opening and then used to retract the prostate anteriorly for the posterior bladder neck dissection. Once through the bladder neck and into the posterior plane of the prostate, the SVs were brought through the opening. The left pedicle was then isolated and systematically ligated with Weck clips and scissors. The nerves were not preserved given the patient's prior history of erectile dysfunction. This was then repeated on the right side.    I then came down through the dorsal venous complex anteriorly down to the membranous urethra using the monopolar. Once down to the urethra, it was transected sharply and the apex of the prostate was then dissected off the levator and rectourethralis muscles. Once the apex of the prostate had been dissected free we came back to the base of the prostate and bluntly push the rectum and nerve vascular bundle off the prostate the patient's left and used clips on the patient's right to free the prostate. Once the prostate was free it was placed off to the side. The pelvis was then irrigated with normal saline and noted to be relatively hemostatic.  The prostate was placed in the Endo Catch bag and the string brought to the 5 mm port.    The vesicourethral anastomosis was then completed with 2 interlocking 3-0 V. lock sutures running the anastomosis in the 6:00 position to the 12:00 position on each  side and then tying it off on the top. The final catheter was then passed through the patient's urethra and into the bladder and 120 cc was instilled into the bladder to test the anastomosis. As there was no leak a 33 [place] Blake drain was passed through the left lateral port and placed around the vesicourethral anastomosis. A 12 mm assistant port on the right lateral side was then closed with 0 Vicryl with the help of the [employer] needle. The 12 mm midline infraumbilical incision was then extended another centimeter taken down and the fascia opened to remove the Endo Catch bag with the prostate specimen. The fascia was then closed with a 0 Vicryl and all skin ports were closed with 4-0 Monocryl in a subcutaneous fashion. Dermabond glue was then applied to the incisions. The drain was then secured to the skin with a 0 nylon stitch and dressing applied.   At the end of the case all laps needles and sponges had been accounted for. There no immediate complications. The patient returned to the PACU in stable condition.

## 2016-04-11 NOTE — Anesthesia Procedure Notes (Signed)
Procedure Name: Intubation Date/Time: 04/11/2016 9:34 AM Performed by: Maxwell Caul Pre-anesthesia Checklist: Patient identified, Emergency Drugs available, Suction available and Patient being monitored Patient Re-evaluated:Patient Re-evaluated prior to inductionOxygen Delivery Method: Circle system utilized Preoxygenation: Pre-oxygenation with 100% oxygen Intubation Type: IV induction Ventilation: Mask ventilation without difficulty and Oral airway inserted - appropriate to patient size Laryngoscope Size: Glidescope and 4 Grade View: Grade I Tube type: Oral Tube size: 7.5 mm Number of attempts: 1 Airway Equipment and Method: Stylet,  Oral airway and Video-laryngoscopy Placement Confirmation: ETT inserted through vocal cords under direct vision,  positive ETCO2 and breath sounds checked- equal and bilateral Secured at: 21 cm Tube secured with: Tape Dental Injury: Teeth and Oropharynx as per pre-operative assessment  Difficulty Due To: Difficulty was anticipated and Difficult Airway- due to reduced neck mobility Comments: Elective GLidescope intubation due to history of difficult intubation. Neck maintained in neutral position during intubation.

## 2016-04-11 NOTE — Anesthesia Postprocedure Evaluation (Signed)
Anesthesia Post Note  Patient: Randy [name]  Procedure(s) Performed: Procedure(s) (LRB): XI ROBOTIC ASSISTED LAPAROSCOPIC RADICAL PROSTATECTOMY (N/A)  Patient location during evaluation: PACU Anesthesia Type: General Level of consciousness: awake and alert and patient cooperative Pain management: pain level controlled Vital Signs Assessment: post-procedure vital signs reviewed and stable Respiratory status: spontaneous breathing and respiratory function stable Cardiovascular status: stable Anesthetic complications: no    Last Vitals:  Vitals:   04/11/16 1523 04/11/16 1544  BP:  132/86  Pulse: 95   Resp:  16  Temp:  36.4 C    Last Pain:  Vitals:   04/11/16 1801  TempSrc:   PainSc: [place]

## 2016-04-12 LAB — BASIC METABOLIC PANEL
ANION GAP: 7 (ref 5–15)
BUN: 19 mg/dL (ref 6–20)
CALCIUM: 8.8 mg/dL — AB (ref 8.9–10.3)
CO2: 25 mmol/L (ref 22–32)
Chloride: 101 mmol/L (ref 101–111)
Creatinine, Ser: 1.81 mg/dL — ABNORMAL HIGH (ref 0.61–1.24)
GFR calc non Af Amer: 36 mL/min — ABNORMAL LOW (ref >60)
GFR, EST AFRICAN AMERICAN: 42 mL/min — AB (ref >60)
GLUCOSE: 181 mg/dL — AB (ref 65–99)
POTASSIUM: 5.6 mmol/L — AB (ref 3.5–5.1)
Sodium: 133 mmol/L — ABNORMAL LOW (ref 135–145)

## 2016-04-12 LAB — HEMOGLOBIN AND HEMATOCRIT, BLOOD
HCT: 37.7 % — ABNORMAL LOW (ref 39.0–52.0)
HEMOGLOBIN: 13 g/dL (ref 13.0–17.0)

## 2016-04-12 LAB — GLUCOSE, CAPILLARY
GLUCOSE-CAPILLARY: 198 mg/dL — AB (ref 65–99)
GLUCOSE-CAPILLARY: 148 mg/dL — AB (ref 65–99)
Glucose-Capillary: 188 mg/dL — ABNORMAL HIGH (ref 65–99)
Glucose-Capillary: 135 mg/dL — ABNORMAL HIGH (ref 65–99)

## 2016-04-12 MED ORDER — FUROSEMIDE 20 MG PO TABS
20.0000 mg | ORAL_TABLET | Freq: Every day | ORAL | Status: DC
  Administered 2016-04-12 – 2016-04-13 (×2): 20 mg via ORAL
  Filled 2016-04-12 (×2): qty 1

## 2016-04-12 MED ORDER — SODIUM CHLORIDE 0.9 % IV SOLN
INTRAVENOUS | Status: DC
  Administered 2016-04-12 (×2): via INTRAVENOUS

## 2016-04-12 MED ORDER — OXYCODONE HCL 5 MG PO TABS
5.0000 mg | ORAL_TABLET | ORAL | Status: DC | PRN
  Administered 2016-04-13 (×2): 5 mg via ORAL
  Filled 2016-04-12 (×2): qty 1

## 2016-04-12 MED ORDER — BISACODYL 10 MG RE SUPP
10.0000 mg | Freq: Once | RECTAL | Status: DC
  Filled 2016-04-12: qty 1

## 2016-04-12 NOTE — Discharge Instructions (Signed)

## 2016-04-12 NOTE — Progress Notes (Addendum)
1 Day Post-Op Subjective: The patient is doing well.  No nausea or vomiting. Pain is adequately controlled. He did ambulate last night. Denies flatus. Slightly hyperkalemic this AM.  Objective: Vital signs in last 24 hours: Temp:  [97.5 F (36.4 C)-98.1 F (36.7 C)] 97.9 F (36.6 C) (08/17 0557) Pulse Rate:  [79-96] 79 (08/17 0557) Resp:  [10-20] 16 (08/17 0557) BP: (99-132)/(62-86) 114/74 (08/17 0557) SpO2:  [92 %-100 %] 92 % (08/17 0557) Weight:  [92.8 kg (204 lb 9 oz)] 92.8 kg (204 lb 9 oz) (08/16 0742)  Intake/Output from previous day: 08/16 0701 - 08/17 0700 In: 3513.3 [P.O.:460; I.V.:2753.3; IV Piggyback:100] Out: 885 [Urine:675; Drains:60; Blood:150] Intake/Output this shift: No intake/output data recorded.  Physical Exam:  General: Alert and oriented. CV: RRR Lungs: Clear bilaterally. GI: Soft, Nondistended. Incisions: Clean, dry, and intact Urine: Clear Extremities: Nontender, no erythema, no edema.  Lab Results:  Recent Labs  04/09/16 0835 04/11/16 1440 04/12/16 0527  HGB 14.3 13.8 13.0  HCT 42.9 41.2 37.7*      Assessment/Plan: POD# 1 s/p robotic prostatectomy.  1) SL IVF 2) Ambulate, Incentive spirometry 3) Transition to oral pain medication 4) Dulcolax suppository 5) D/C pelvic drain 6) Possible discharge later today vs tomorrow    LOS: 1 day   Lolita Rieger 04/12/2016, 7:06 AM   Slow to move around, does not appear to be ready for discharge today.  Will recheck his potassium tomorrow.  If normal we will plan to discharge him tomorrow.

## 2016-04-13 LAB — BASIC METABOLIC PANEL
Anion gap: 7 (ref 5–15)
BUN: 24 mg/dL — AB (ref 6–20)
CALCIUM: 9 mg/dL (ref 8.9–10.3)
CHLORIDE: 107 mmol/L (ref 101–111)
CO2: 22 mmol/L (ref 22–32)
CREATININE: 1.87 mg/dL — AB (ref 0.61–1.24)
GFR, EST AFRICAN AMERICAN: 41 mL/min — AB (ref >60)
GFR, EST NON AFRICAN AMERICAN: 35 mL/min — AB (ref >60)
Glucose, Bld: 125 mg/dL — ABNORMAL HIGH (ref 65–99)
Potassium: 4.8 mmol/L (ref 3.5–5.1)
SODIUM: 136 mmol/L (ref 135–145)

## 2016-04-13 LAB — GLUCOSE, CAPILLARY
GLUCOSE-CAPILLARY: 123 mg/dL — AB (ref 65–99)
GLUCOSE-CAPILLARY: 195 mg/dL — AB (ref 65–99)

## 2016-04-13 MED ORDER — OXYCODONE HCL 5 MG PO TABS
10.0000 mg | ORAL_TABLET | ORAL | Status: DC | PRN
  Administered 2016-04-13 (×2): 10 mg via ORAL
  Filled 2016-04-13 (×2): qty 2

## 2016-04-13 NOTE — Discharge Summary (Signed)
Date of admission: 04/11/2016  Date of discharge: 04/13/2016  Admission diagnosis: Prostate Cancer  Discharge diagnosis: Prostate Cancer  History and Physical: For full details, please see admission history and physical. Briefly, Randy [name] is a 70 y.o. gentleman with localized prostate cancer.  After discussing management/treatment options, he elected to proceed with surgical treatment.  Hospital Course: Randy [name] was taken to the operating room on 04/11/2016 and underwent a robotic assisted laparoscopic radical prostatectomy. He tolerated this procedure well and without complications. Postoperatively, he was able to be transferred to a regular hospital room following recovery from anesthesia.  He was able to begin ambulating the night of surgery. He remained hemodynamically stable overnight.  He had excellent urine output with appropriately minimal output from his pelvic drain and his pelvic drain was removed on POD #2.  He was transitioned to oral pain medication, tolerated a clear liquid diet, and had met all discharge criteria. He will able to be discharged home later on POD#2 pending resolution of hyperkalemia on his BMP.  Laboratory values:   Recent Labs  04/11/16 1440 04/12/16 0527  HGB 13.8 13.0  HCT 41.2 37.7*    Disposition: Home  Discharge instruction: He was instructed to be ambulatory but to refrain from heavy lifting, strenuous activity, or driving. He was instructed on urethral catheter care.  Discharge medications:     Medication List    STOP taking these medications   terazosin 2 MG capsule Commonly known as:  HYTRIN     TAKE these medications   allopurinol 100 MG tablet Commonly known as:  ZYLOPRIM Take 100 mg by mouth at bedtime.   atorvastatin 40 MG tablet Commonly known as:  LIPITOR Take 20 mg by mouth at bedtime.   dextrose 40 % Gel Commonly known as:  GLUTOSE Take 1 Tube by mouth as needed for low blood sugar.   furosemide 20 MG  tablet Commonly known as:  LASIX Take 20 mg by mouth daily.   gabapentin 300 MG capsule Commonly known as:  NEURONTIN Take 300 mg by mouth 2 (two) times daily.   guaifenesin 400 MG Tabs tablet Commonly known as:  HUMIBID E Take 400 mg by mouth every 6 (six) hours as needed (congestion).   insulin NPH-regular Human (70-30) 100 UNIT/ML injection Commonly known as:  NOVOLIN 70/30 Inject 30-65 Units into the skin 2 (two) times daily. Dose according to BS reading. Check twice daily.   lisinopril 40 MG tablet Commonly known as:  PRINIVIL,ZESTRIL Take 20 mg by mouth every morning.   oxybutynin 5 MG tablet Commonly known as:  DITROPAN Take 5 mg by mouth 2 (two) times daily.   polyethylene glycol powder powder Commonly known as:  GLYCOLAX/MIRALAX Take 17 g by mouth daily.   sulfamethoxazole-trimethoprim 800-160 MG tablet Commonly known as:  BACTRIM DS,SEPTRA DS Take 1 tablet by mouth 2 (two) times daily. Start day prior to catheter removal.   traMADol 50 MG tablet Commonly known as:  ULTRAM Take 2 tablets (100 mg total) by mouth every 6 (six) hours as needed.       Followup: He will followup in 1 week for catheter removal and to discuss his surgical pathology results.

## 2016-04-13 NOTE — Progress Notes (Signed)
Pt has refused CPAP for the night.  RT to monitor and assess as needed.  

## 2017-09-02 ENCOUNTER — Telehealth (INDEPENDENT_AMBULATORY_CARE_PROVIDER_SITE_OTHER): Payer: Self-pay | Admitting: Radiology

## 2017-09-02 NOTE — Telephone Encounter (Signed)
Patient called to see if we received his referral from [facility]. We have not as of yet.  It is for an HNP Lumbar.  I tentatively scheduled with [facility] on 09/17/17, pending receipt of referral/auth.  Due to patient's severe pain and [facility] sched being booked out.  Patient says the referral is approved, they are preparing packet to send out.  He is aware we will cancel this appt if not received.  Can you help me watch for this one?  Also I would like to move him up if a sooner appt becomes available.  Thanks-

## 2017-09-04 NOTE — Telephone Encounter (Signed)
Spoke with patient and he states he was told by the [facility] that the visit was approved and the records was on the way to our office .

## 2017-09-12 NOTE — Telephone Encounter (Signed)
Have you seen any records come through yet?

## 2017-09-13 ENCOUNTER — Telehealth (INDEPENDENT_AMBULATORY_CARE_PROVIDER_SITE_OTHER): Payer: Self-pay | Admitting: Orthopaedic Surgery

## 2017-09-13 NOTE — Telephone Encounter (Signed)
Patient left a message on Friday wanting to know if we have received the paperwork from the [place].  Please advise.  CB#[phone].  Thank you.

## 2017-09-13 NOTE — Telephone Encounter (Signed)
Have you seen anything?

## 2017-09-16 NOTE — Telephone Encounter (Signed)
IC patient and advised we still have not received anything, but that we are keeping a watch out for it.

## 2017-09-16 NOTE — Telephone Encounter (Signed)
When IC patient he says the auth # [id number] and that they faxed something to [place] Friday 09/13/2017, can you check with Tammy? Patient's appt is tomorrow- we had tentatively schedule it hoping we would get the auth packet from them.  Thanks.

## 2017-09-16 NOTE — Telephone Encounter (Signed)
I spoke to patient again and he gave me phone number [phone], ext [phone], for [facility].  IC today even though they are closed and LMVM for her to call me tomorrow am ASAP, to discuss, we did not get fax as of yet.  I will followup first thing in the am. Patient states Mardene Celeste will be there at 730am.

## 2017-09-17 ENCOUNTER — Ambulatory Visit (INDEPENDENT_AMBULATORY_CARE_PROVIDER_SITE_OTHER): Payer: Self-pay | Admitting: Orthopaedic Surgery

## 2017-09-17 NOTE — Telephone Encounter (Signed)
Randy [name] is still working on this referral, we are calling to [place]. Patient resched today, he lives in [place] and will try to come to the [place] office this Thursday if we get auth/records in time.

## 2017-09-17 NOTE — Telephone Encounter (Signed)
Cd's and records/auth received.

## 2017-09-19 ENCOUNTER — Ambulatory Visit (INDEPENDENT_AMBULATORY_CARE_PROVIDER_SITE_OTHER): Payer: Non-veteran care

## 2017-09-19 ENCOUNTER — Encounter (INDEPENDENT_AMBULATORY_CARE_PROVIDER_SITE_OTHER): Payer: Self-pay | Admitting: Orthopaedic Surgery

## 2017-09-19 ENCOUNTER — Ambulatory Visit (INDEPENDENT_AMBULATORY_CARE_PROVIDER_SITE_OTHER): Payer: Non-veteran care | Admitting: Orthopaedic Surgery

## 2017-09-19 VITALS — BP 137/80 | HR 89 | Ht 67.0 in | Wt 204.0 lb

## 2017-09-19 DIAGNOSIS — M542 Cervicalgia: Secondary | ICD-10-CM | POA: Diagnosis not present

## 2017-09-19 DIAGNOSIS — M545 Low back pain: Secondary | ICD-10-CM

## 2017-09-19 DIAGNOSIS — M48062 Spinal stenosis, lumbar region with neurogenic claudication: Secondary | ICD-10-CM

## 2017-09-19 DIAGNOSIS — M5126 Other intervertebral disc displacement, lumbar region: Secondary | ICD-10-CM | POA: Diagnosis not present

## 2017-09-19 DIAGNOSIS — G8929 Other chronic pain: Secondary | ICD-10-CM

## 2017-09-19 NOTE — Progress Notes (Signed)
Office Visit Note   Patient: Randy [name]           Date of Birth: 13-May-1946           MRN: [id number] Visit Date: 09/19/2017              Requested by: Monico Blitz, MD [address] [place], [place] [address] PCP: Monico Blitz, MD   Assessment & Plan: Visit Diagnoses:  1. Neck pain   2. Chronic bilateral low back pain, with sciatica presence unspecified   3. Spinal stenosis of lumbar region with neurogenic claudication   4. Lumbar disc herniation     Plan: Patient has a large  L2-3 disc herniation with severe stenosis.  He has no anterolisthesis and no instability.  Plan would be laminectomy for adequate exposure and microdiscectomy for removal of  Large HNP causing severe spinal stenosis.  Risk of rerupture at 5-10% discussed.  Possibility of developing later instability with need for fusion discussed he understands this is unlikely.  He understands of some the numbness and tingling he has may be due to his cervical spinal cord myelomalacia pre-existing .  Change hand deformity.  Should get improvement in his back pain and leg weakness.  Discussed questions were elicited and answered he understands and requests we proceed.  Follow-Up Instructions: Preop for level of large HNP L2-3 causing spinal stenosis.  Patient will follow-up 1 week postop.  Orders:  Orders Placed This Encounter  Procedures  . XR Cervical Spine 2 or 3 views  . XR Lumbar Spine 2-3 Views   No orders of the defined types were placed in this encounter.     Procedures: No procedures performed   Clinical Data: No additional findings.   Subjective: Chief Complaint  Patient presents with  . Lower Back - Pain    HPI 72 year old [employer] veteran referred by the [place] system for treatment of large L2-3 HNP with severe stenosis had chronic back pain for greater than 5 months.  Complex history with multiple cervical spine surgeries and residual right hand with clawing.  He had several posterior cervical procedure  stenosis with 2015 with grafts and plates from E9-F8 by Dr. Newman Pies is healed.  MRI scan showed some right side cord myelomalacia at the C3-4 level which is now stabilized and healed.  Patient's had back pain bilateral leg numbness and tingling.  Is ambulatory with a cane.  He was referred for surgical intervention due to the large H&P and severe spinal stenosis.  Patient states he has had urinary urgency chronic problem but no urinary retention and no associated bowel symptoms.  MRI scan on disc shows large central extrusion at L2-3 with severe spinal canal stenosis and compression of the cauda equina nerve roots left greater than right. Patient has increased numbness and with ambulation and has to stop sit and rest and gets relief.  He does better with lumbar flexion with ambulation leaning on a grocery cart. Review of Systems 14 point review of systems positive for history of hepatitis, diabetes or insulin type II acid reflux, bladder problems with apnea previous multiple cervical procedures.  Right hand clawing deformity carpal tunnel release.  PTSD.  Smoker.  Chronic neck pain chronic right C8 radiculopathy post right C7 through T1 foraminotomy.  Lateral patellar chondromalacia.  Hypertension, hyperlipidemia.   Objective: Vital Signs: BP 137/80   Pulse 89   Ht 5\' 7"  (1.702 m)   Wt 204 lb (92.5 kg)   BMI 31.95 kg/m  Physical Exam  Constitutional: He is oriented to person, place, and time. He appears well-developed and well-nourished.  HENT:  Head: Normocephalic and atraumatic.  Eyes: EOM are normal. Pupils are equal, round, and reactive to light.  Neck: No tracheal deviation present. No thyromegaly present.  Cardiovascular: Normal rate.  Pulmonary/Chest: Effort normal. He has no wheezes.  Abdominal: Soft. Bowel sounds are normal.  Neurological: He is alert and oriented to person, place, and time.  Skin: Skin is warm and dry. Capillary refill takes less than 2 seconds.    Psychiatric: He has a normal mood and affect. His behavior is normal. Judgment and thought content normal.    Ortho Exam anterior and posterior cervical incisions well-healed.  Right hand clawing deformity with intrinsic atrophy consistent with C8 deficit.  Patient has good hip flexors quads no pain with internal/external rotation of the hips knees reach full extension he has some patellofemoral crepitus.  Anterior tib EHL is intact he ambulates with a cane.  Some symmetrical decreased sensation both legs below the knees anterior thighs bilaterally. Specialty Comments:  No specialty comments available.  Imaging: No results found.   PMFS History: Patient Active Problem List   Diagnosis Date Noted  . Prostate cancer ([place]) 04/11/2016  . Cervical spondylosis with myelopathy 03/04/2014   Past Medical History:  Diagnosis Date  . Anxiety    HAS  PTSD  . BPH (benign prostatic hypertrophy)   . Cancer ([place])    PROSTATE  . Cataracts, bilateral   . CKD (chronic kidney disease), stage III ([place])   . Cough    FOR 1 YEAR WITH THICK WHITE PHLEGM  . Difficult intubation 2015   KEPT OVERNIGHT WITH LAST NECK SURGERY, TOLD TUBE WAS HARDER TO INSERT  . ED (erectile dysfunction)   . GERD (gastroesophageal reflux disease)   . H/O bladder problems   . Hepatitis   . History of adenomatous polyp of colon   . History of condyloma acuminatum   . History of syncope    2003-- ? idiology--  per echo ef >55%  . Hypertension   . Insulin dependent type 2 diabetes mellitus ([place])   . Malfunction of penile prosthesis ([place])   . OA (osteoarthritis)    hips  . OSA on CPAP    severe OSA  per study 07-01-2002  (done at [facility])  . Peripheral neuropathy   . Pneumonia 2015  . PTSD (post-traumatic stress disorder)   . Sleep apnea   . Spondylolysis of cervical region   . Wears glasses     No family history on file.  Past Surgical History:  Procedure Laterality Date  . ANTERIOR CERVICAL DECOMP/DISCECTOMY FUSION  N/A 03/04/2014   Procedure: ANTERIOR CERVICAL DECOMPRESSION/DISCECTOMY FUSION 2 LEVELS;  Surgeon: Ophelia Charter, MD;  Location: [facility];  Service: Neurosurgery;  Laterality: N/A;  C34 C45 anterior cervical decompression with fusion interbody prosthesis plating and bonegraft  . CARPAL TUNNEL RELEASE Right 03/2004  . CERVICAL FORAMENECTOMY  Nov 2005//   2000   right  C7 -- T1/   yr 2000 unknown level  . PENILE PROSTHESIS IMPLANT N/A 03/04/2015   Procedure: REMOVAL AND REPLACMENT PENILE PROTHESIS INFLATABLE COLOPLAST INFRAPUBIC APPROACH;  Surgeon: Kathie Rhodes, MD;  Location: [facility];  Service: Urology;  Laterality: N/A;  . [place]  . REMOVAL CYST FROM CHEEK  Jan 2016   benign  . ROBOT ASSISTED LAPAROSCOPIC RADICAL PROSTATECTOMY N/A 04/11/2016   Procedure: XI ROBOTIC ASSISTED LAPAROSCOPIC  RADICAL PROSTATECTOMY;  Surgeon: Ardis Hughs, MD;  Location: [facility];  Service: Urology;  Laterality: N/A;   Social History   Occupational History  . Not on file  Tobacco Use  . Smoking status: Current Every Day Smoker    Packs/day: 0.50    Years: 51.00    Pack years: 25.50    Types: Cigarettes  . Smokeless tobacco: Never Used  Substance and Sexual Activity  . Alcohol use: Yes    Comment: very  rare   moonshine  . Drug use: No  . Sexual activity: Not on file

## 2017-09-23 ENCOUNTER — Telehealth (INDEPENDENT_AMBULATORY_CARE_PROVIDER_SITE_OTHER): Payer: Self-pay | Admitting: Orthopaedic Surgery

## 2017-09-23 NOTE — Telephone Encounter (Signed)
09/19/2017 OV NOTE FAXED TO [facility] [phone], ATTNPhineas Real

## 2017-09-24 ENCOUNTER — Encounter (INDEPENDENT_AMBULATORY_CARE_PROVIDER_SITE_OTHER): Payer: Self-pay | Admitting: Orthopaedic Surgery

## 2017-09-27 ENCOUNTER — Encounter (HOSPITAL_COMMUNITY): Payer: Self-pay | Admitting: *Deleted

## 2017-09-27 ENCOUNTER — Other Ambulatory Visit: Payer: Self-pay

## 2017-09-27 NOTE — Progress Notes (Signed)
Spoke with pt for pre-op call. Pt denies cardiac history, chest pain or sob. Pt is a type 2 diabetic. Pt can't remember what his last A1C was or when it was. He states his fasting blood sugar is usually in the 70's. Pt instructed to take 70% of his Novolin 70/30 insulin Sunday PM (pt doesn't take the same amount each night "depends on what my blood sugar is"). Pt instructed not to take his Novolin 70/30 at all Monday AM. Pt instructed to check his blood sugar when he gets up Monday AM and every 2 hours until he leaves for the hospital. If blood sugar is 70 or below, treat with 1/2 cup of clear juice (apple or cranberry) and recheck blood sugar 15 minutes after drinking juice. If blood sugar continues to be 70 or below, call the Short Stay department and ask to speak to a nurse. Pt voiced understanding.

## 2017-09-30 ENCOUNTER — Ambulatory Visit (HOSPITAL_COMMUNITY): Payer: Non-veteran care | Admitting: Certified Registered"

## 2017-09-30 ENCOUNTER — Ambulatory Visit (HOSPITAL_COMMUNITY): Payer: Non-veteran care

## 2017-09-30 ENCOUNTER — Other Ambulatory Visit: Payer: Self-pay

## 2017-09-30 ENCOUNTER — Encounter (HOSPITAL_COMMUNITY)
Admission: RE | Disposition: A | Discharge status: Home / Self Care | Payer: Self-pay | Source: Ambulatory Visit | Attending: Orthopaedic Surgery

## 2017-09-30 ENCOUNTER — Encounter (HOSPITAL_COMMUNITY): Payer: Self-pay

## 2017-09-30 ENCOUNTER — Observation Stay (HOSPITAL_COMMUNITY)
Admission: RE | Admit: 2017-09-30 | Discharge: 2017-10-01 | Disposition: A | Discharge status: Home / Self Care | Payer: Non-veteran care | Source: Ambulatory Visit | Attending: Orthopaedic Surgery | Admitting: Orthopaedic Surgery

## 2017-09-30 DIAGNOSIS — F17210 Nicotine dependence, cigarettes, uncomplicated: Secondary | ICD-10-CM | POA: Diagnosis not present

## 2017-09-30 DIAGNOSIS — E1136 Type 2 diabetes mellitus with diabetic cataract: Secondary | ICD-10-CM | POA: Insufficient documentation

## 2017-09-30 DIAGNOSIS — M5126 Other intervertebral disc displacement, lumbar region: Secondary | ICD-10-CM | POA: Diagnosis not present

## 2017-09-30 DIAGNOSIS — Z419 Encounter for procedure for purposes other than remedying health state, unspecified: Secondary | ICD-10-CM

## 2017-09-30 DIAGNOSIS — M48062 Spinal stenosis, lumbar region with neurogenic claudication: Secondary | ICD-10-CM | POA: Diagnosis not present

## 2017-09-30 DIAGNOSIS — T884XXD Failed or difficult intubation, subsequent encounter: Secondary | ICD-10-CM

## 2017-09-30 DIAGNOSIS — G4733 Obstructive sleep apnea (adult) (pediatric): Secondary | ICD-10-CM | POA: Diagnosis not present

## 2017-09-30 DIAGNOSIS — E1122 Type 2 diabetes mellitus with diabetic chronic kidney disease: Secondary | ICD-10-CM | POA: Diagnosis not present

## 2017-09-30 DIAGNOSIS — K219 Gastro-esophageal reflux disease without esophagitis: Secondary | ICD-10-CM | POA: Insufficient documentation

## 2017-09-30 DIAGNOSIS — M549 Dorsalgia, unspecified: Secondary | ICD-10-CM | POA: Diagnosis present

## 2017-09-30 DIAGNOSIS — Z9989 Dependence on other enabling machines and devices: Secondary | ICD-10-CM | POA: Insufficient documentation

## 2017-09-30 DIAGNOSIS — Z79899 Other long term (current) drug therapy: Secondary | ICD-10-CM | POA: Insufficient documentation

## 2017-09-30 DIAGNOSIS — E1142 Type 2 diabetes mellitus with diabetic polyneuropathy: Secondary | ICD-10-CM | POA: Diagnosis not present

## 2017-09-30 DIAGNOSIS — Z794 Long term (current) use of insulin: Secondary | ICD-10-CM | POA: Insufficient documentation

## 2017-09-30 DIAGNOSIS — N183 Chronic kidney disease, stage 3 (moderate): Secondary | ICD-10-CM | POA: Diagnosis not present

## 2017-09-30 DIAGNOSIS — I129 Hypertensive chronic kidney disease with stage 1 through stage 4 chronic kidney disease, or unspecified chronic kidney disease: Secondary | ICD-10-CM | POA: Diagnosis not present

## 2017-09-30 HISTORY — PX: LUMBAR LAMINECTOMY/DECOMPRESSION MICRODISCECTOMY: SHX5026

## 2017-09-30 HISTORY — DX: Other intervertebral disc displacement, lumbar region: M51.26

## 2017-09-30 HISTORY — DX: Failed or difficult intubation, subsequent encounter: T88.4XXD

## 2017-09-30 LAB — URINALYSIS, ROUTINE W REFLEX MICROSCOPIC
Bilirubin Urine: NEGATIVE
Glucose, UA: NEGATIVE mg/dL
HGB URINE DIPSTICK: NEGATIVE
KETONES UR: NEGATIVE mg/dL
Leukocytes, UA: NEGATIVE
Nitrite: NEGATIVE
Protein, ur: NEGATIVE mg/dL
Specific Gravity, Urine: 1.013 (ref 1.005–1.030)
pH: 6 (ref 5.0–8.0)

## 2017-09-30 LAB — COMPREHENSIVE METABOLIC PANEL
ALT: 20 U/L (ref 17–63)
AST: 23 U/L (ref 15–41)
Albumin: 3.6 g/dL (ref 3.5–5.0)
Alkaline Phosphatase: 81 U/L (ref 38–126)
Anion gap: 12 (ref 5–15)
BILIRUBIN TOTAL: 0.7 mg/dL (ref 0.3–1.2)
BUN: 16 mg/dL (ref 6–20)
CO2: 20 mmol/L — ABNORMAL LOW (ref 22–32)
CREATININE: 1.71 mg/dL — AB (ref 0.61–1.24)
Calcium: 9.4 mg/dL (ref 8.9–10.3)
Chloride: 106 mmol/L (ref 101–111)
GFR calc Af Amer: 45 mL/min — ABNORMAL LOW (ref >60)
GFR, EST NON AFRICAN AMERICAN: 38 mL/min — AB (ref >60)
Glucose, Bld: 101 mg/dL — ABNORMAL HIGH (ref 65–99)
Potassium: 4.2 mmol/L (ref 3.5–5.1)
Sodium: 138 mmol/L (ref 135–145)
TOTAL PROTEIN: 7.1 g/dL (ref 6.5–8.1)

## 2017-09-30 LAB — CBC
HEMATOCRIT: 44.1 % (ref 39.0–52.0)
Hemoglobin: 14.9 g/dL (ref 13.0–17.0)
MCH: 28.8 pg (ref 26.0–34.0)
MCHC: 33.8 g/dL (ref 30.0–36.0)
MCV: 85.1 fL (ref 78.0–100.0)
Platelets: 165 10*3/uL (ref 150–400)
RBC: 5.18 MIL/uL (ref 4.22–5.81)
RDW: 14.7 % (ref 11.5–15.5)
WBC: 10.3 10*3/uL (ref 4.0–10.5)

## 2017-09-30 LAB — HEMOGLOBIN A1C
HEMOGLOBIN A1C: 6.6 % — AB (ref 4.8–5.6)
Mean Plasma Glucose: 142.72 mg/dL

## 2017-09-30 LAB — GLUCOSE, CAPILLARY
GLUCOSE-CAPILLARY: 103 mg/dL — AB (ref 65–99)
GLUCOSE-CAPILLARY: 78 mg/dL (ref 65–99)
GLUCOSE-CAPILLARY: 290 mg/dL — AB (ref 65–99)

## 2017-09-30 LAB — SURGICAL PCR SCREEN
MRSA, PCR: NEGATIVE
Staphylococcus aureus: NEGATIVE

## 2017-09-30 SURGERY — LUMBAR LAMINECTOMY/DECOMPRESSION MICRODISCECTOMY
Anesthesia: General

## 2017-09-30 MED ORDER — OXYCODONE HCL 5 MG PO TABS
5.0000 mg | ORAL_TABLET | ORAL | Status: DC | PRN
  Administered 2017-09-30 – 2017-10-01 (×2): 5 mg via ORAL
  Filled 2017-09-30 (×2): qty 1

## 2017-09-30 MED ORDER — ATORVASTATIN CALCIUM 20 MG PO TABS
40.0000 mg | ORAL_TABLET | Freq: Every day | ORAL | Status: DC
  Administered 2017-09-30: 40 mg via ORAL
  Filled 2017-09-30: qty 2

## 2017-09-30 MED ORDER — SODIUM CHLORIDE 0.9 % IV SOLN
INTRAVENOUS | Status: DC
  Administered 2017-09-30 (×2): via INTRAVENOUS

## 2017-09-30 MED ORDER — INSULIN ASPART 100 UNIT/ML ~~LOC~~ SOLN: 0.0000 [IU] | Freq: Three times a day (TID) | SUBCUTANEOUS | Status: DC

## 2017-09-30 MED ORDER — ROCURONIUM BROMIDE 10 MG/ML (PF) SYRINGE
PREFILLED_SYRINGE | INTRAVENOUS | Status: DC | PRN
  Administered 2017-09-30 (×2): 40 mg via INTRAVENOUS

## 2017-09-30 MED ORDER — HYDROMORPHONE HCL 1 MG/ML IJ SOLN
0.2500 mg | INTRAMUSCULAR | Status: DC | PRN
  Administered 2017-09-30 (×4): 0.5 mg via INTRAVENOUS

## 2017-09-30 MED ORDER — PHENOL 1.4 % MT LIQD: 1.0000 | OROMUCOSAL | Status: DC | PRN

## 2017-09-30 MED ORDER — ALBUMIN HUMAN 5 % IV SOLN
INTRAVENOUS | Status: DC | PRN
  Administered 2017-09-30: 17:00:00 via INTRAVENOUS

## 2017-09-30 MED ORDER — MEPERIDINE HCL 25 MG/ML IJ SOLN: 6.2500 mg | INTRAMUSCULAR | Status: DC | PRN

## 2017-09-30 MED ORDER — PROPOFOL 10 MG/ML IV BOLUS
INTRAVENOUS | Status: DC | PRN
  Administered 2017-09-30: 120 mg via INTRAVENOUS

## 2017-09-30 MED ORDER — ARTIFICIAL TEARS OPHTHALMIC OINT
TOPICAL_OINTMENT | OPHTHALMIC | Status: AC
  Filled 2017-09-30: qty 3.5

## 2017-09-30 MED ORDER — LISINOPRIL 20 MG PO TABS
20.0000 mg | ORAL_TABLET | ORAL | Status: DC
  Administered 2017-10-01: 20 mg via ORAL
  Filled 2017-09-30: qty 1

## 2017-09-30 MED ORDER — ROCURONIUM BROMIDE 10 MG/ML (PF) SYRINGE
PREFILLED_SYRINGE | INTRAVENOUS | Status: AC
  Filled 2017-09-30: qty 10

## 2017-09-30 MED ORDER — ARTIFICIAL TEARS OPHTHALMIC OINT
TOPICAL_OINTMENT | OPHTHALMIC | Status: DC | PRN
  Administered 2017-09-30: 1 via OPHTHALMIC

## 2017-09-30 MED ORDER — SODIUM CHLORIDE 0.9% FLUSH: 3.0000 mL | Freq: Two times a day (BID) | INTRAVENOUS | Status: DC

## 2017-09-30 MED ORDER — ONDANSETRON HCL 4 MG/2ML IJ SOLN: 4.0000 mg | Freq: Once | INTRAMUSCULAR | Status: DC | PRN

## 2017-09-30 MED ORDER — EPHEDRINE 5 MG/ML INJ
INTRAVENOUS | Status: AC
  Filled 2017-09-30: qty 20

## 2017-09-30 MED ORDER — ESMOLOL HCL 100 MG/10ML IV SOLN
INTRAVENOUS | Status: AC
  Filled 2017-09-30: qty 20

## 2017-09-30 MED ORDER — ONDANSETRON HCL 4 MG PO TABS: 4.0000 mg | ORAL_TABLET | Freq: Four times a day (QID) | ORAL | Status: DC | PRN

## 2017-09-30 MED ORDER — METOPROLOL TARTRATE 5 MG/5ML IV SOLN
INTRAVENOUS | Status: DC | PRN
  Administered 2017-09-30 (×2): 2.5 mg via INTRAVENOUS

## 2017-09-30 MED ORDER — DEXAMETHASONE SODIUM PHOSPHATE 10 MG/ML IJ SOLN
INTRAMUSCULAR | Status: DC | PRN
  Administered 2017-09-30: 5 mg via INTRAVENOUS

## 2017-09-30 MED ORDER — ONDANSETRON HCL 4 MG/2ML IJ SOLN: 4.0000 mg | Freq: Four times a day (QID) | INTRAMUSCULAR | Status: DC | PRN

## 2017-09-30 MED ORDER — ONDANSETRON HCL 4 MG/2ML IJ SOLN
INTRAMUSCULAR | Status: DC | PRN
  Administered 2017-09-30: 4 mg via INTRAVENOUS

## 2017-09-30 MED ORDER — PHENYLEPHRINE 40 MCG/ML (10ML) SYRINGE FOR IV PUSH (FOR BLOOD PRESSURE SUPPORT)
PREFILLED_SYRINGE | INTRAVENOUS | Status: AC
  Filled 2017-09-30: qty 20

## 2017-09-30 MED ORDER — 0.9 % SODIUM CHLORIDE (POUR BTL) OPTIME
TOPICAL | Status: DC | PRN
  Administered 2017-09-30: 1000 mL

## 2017-09-30 MED ORDER — FENTANYL CITRATE (PF) 250 MCG/5ML IJ SOLN
INTRAMUSCULAR | Status: AC
  Filled 2017-09-30: qty 5

## 2017-09-30 MED ORDER — ALLOPURINOL 100 MG PO TABS
100.0000 mg | ORAL_TABLET | Freq: Every day | ORAL | Status: DC
  Administered 2017-09-30: 100 mg via ORAL
  Filled 2017-09-30: qty 1

## 2017-09-30 MED ORDER — MIDAZOLAM HCL 2 MG/2ML IJ SOLN
INTRAMUSCULAR | Status: AC
  Filled 2017-09-30: qty 2

## 2017-09-30 MED ORDER — METOPROLOL TARTRATE 5 MG/5ML IV SOLN
INTRAVENOUS | Status: AC
  Filled 2017-09-30: qty 5

## 2017-09-30 MED ORDER — MIDAZOLAM HCL 5 MG/5ML IJ SOLN
INTRAMUSCULAR | Status: DC | PRN
  Administered 2017-09-30: 2 mg via INTRAVENOUS

## 2017-09-30 MED ORDER — SODIUM CHLORIDE 0.9 % IV SOLN: INTRAVENOUS | Status: DC

## 2017-09-30 MED ORDER — CEFAZOLIN SODIUM-DEXTROSE 1-4 GM/50ML-% IV SOLN
1.0000 g | Freq: Three times a day (TID) | INTRAVENOUS | Status: AC
  Administered 2017-09-30: 1 g via INTRAVENOUS
  Filled 2017-09-30: qty 50

## 2017-09-30 MED ORDER — ACETAMINOPHEN 325 MG PO TABS: 650.0000 mg | ORAL_TABLET | ORAL | Status: DC | PRN

## 2017-09-30 MED ORDER — POLYETHYLENE GLYCOL 3350 17 G PO PACK: 17.0000 g | PACK | Freq: Every day | ORAL | Status: DC | PRN

## 2017-09-30 MED ORDER — SODIUM CHLORIDE 0.9 % IV SOLN: 250.0000 mL | INTRAVENOUS | Status: DC

## 2017-09-30 MED ORDER — HYDROMORPHONE HCL 1 MG/ML IJ SOLN
INTRAMUSCULAR | Status: AC
  Filled 2017-09-30: qty 1

## 2017-09-30 MED ORDER — PHENYLEPHRINE 40 MCG/ML (10ML) SYRINGE FOR IV PUSH (FOR BLOOD PRESSURE SUPPORT)
PREFILLED_SYRINGE | INTRAVENOUS | Status: DC | PRN
  Administered 2017-09-30: 240 ug via INTRAVENOUS
  Administered 2017-09-30: 80 ug via INTRAVENOUS

## 2017-09-30 MED ORDER — SUGAMMADEX SODIUM 200 MG/2ML IV SOLN
INTRAVENOUS | Status: DC | PRN
  Administered 2017-09-30: 400 mg via INTRAVENOUS

## 2017-09-30 MED ORDER — FUROSEMIDE 20 MG PO TABS: 20.0000 mg | ORAL_TABLET | Freq: Every day | ORAL | Status: DC

## 2017-09-30 MED ORDER — INSULIN ASPART 100 UNIT/ML ~~LOC~~ SOLN
0.0000 [IU] | Freq: Every day | SUBCUTANEOUS | Status: DC
  Administered 2017-09-30: 3 [IU] via SUBCUTANEOUS

## 2017-09-30 MED ORDER — DEXTROSE 5 % IV SOLN: 500.0000 mg | Freq: Four times a day (QID) | INTRAVENOUS | Status: DC | PRN

## 2017-09-30 MED ORDER — METHOCARBAMOL 500 MG PO TABS
500.0000 mg | ORAL_TABLET | Freq: Four times a day (QID) | ORAL | Status: DC | PRN
  Administered 2017-09-30 – 2017-10-01 (×2): 500 mg via ORAL
  Filled 2017-09-30 (×2): qty 1

## 2017-09-30 MED ORDER — SUCCINYLCHOLINE CHLORIDE 200 MG/10ML IV SOSY
PREFILLED_SYRINGE | INTRAVENOUS | Status: AC
  Filled 2017-09-30: qty 10

## 2017-09-30 MED ORDER — DEXAMETHASONE SODIUM PHOSPHATE 10 MG/ML IJ SOLN
INTRAMUSCULAR | Status: AC
  Filled 2017-09-30: qty 2

## 2017-09-30 MED ORDER — BUPIVACAINE HCL (PF) 0.25 % IJ SOLN
INTRAMUSCULAR | Status: AC
  Filled 2017-09-30: qty 30

## 2017-09-30 MED ORDER — MENTHOL 3 MG MT LOZG: 1.0000 | LOZENGE | OROMUCOSAL | Status: DC | PRN

## 2017-09-30 MED ORDER — PHENYLEPHRINE HCL 10 MG/ML IJ SOLN
INTRAVENOUS | Status: DC | PRN
  Administered 2017-09-30: 25 ug/min via INTRAVENOUS

## 2017-09-30 MED ORDER — LIDOCAINE 2% (20 MG/ML) 5 ML SYRINGE
INTRAMUSCULAR | Status: AC
  Filled 2017-09-30: qty 10

## 2017-09-30 MED ORDER — ESMOLOL HCL 100 MG/10ML IV SOLN
INTRAVENOUS | Status: DC | PRN
  Administered 2017-09-30: 30 mg via INTRAVENOUS
  Administered 2017-09-30: 20 mg via INTRAVENOUS

## 2017-09-30 MED ORDER — OXYBUTYNIN CHLORIDE 5 MG PO TABS
5.0000 mg | ORAL_TABLET | Freq: Two times a day (BID) | ORAL | Status: DC
  Administered 2017-09-30: 5 mg via ORAL
  Filled 2017-09-30 (×2): qty 1

## 2017-09-30 MED ORDER — SODIUM CHLORIDE 0.9% FLUSH: 3.0000 mL | INTRAVENOUS | Status: DC | PRN

## 2017-09-30 MED ORDER — ACETAMINOPHEN 650 MG RE SUPP: 650.0000 mg | RECTAL | Status: DC | PRN

## 2017-09-30 MED ORDER — CHLORHEXIDINE GLUCONATE 4 % EX LIQD: 60.0000 mL | Freq: Once | CUTANEOUS | Status: DC

## 2017-09-30 MED ORDER — LIDOCAINE 2% (20 MG/ML) 5 ML SYRINGE
INTRAMUSCULAR | Status: DC | PRN
  Administered 2017-09-30: 100 mg via INTRAVENOUS

## 2017-09-30 MED ORDER — CEFAZOLIN SODIUM-DEXTROSE 2-4 GM/100ML-% IV SOLN
2.0000 g | INTRAVENOUS | Status: AC
  Administered 2017-09-30: 2 g via INTRAVENOUS
  Filled 2017-09-30: qty 100

## 2017-09-30 MED ORDER — FENTANYL CITRATE (PF) 100 MCG/2ML IJ SOLN
INTRAMUSCULAR | Status: DC | PRN
  Administered 2017-09-30 (×2): 50 ug via INTRAVENOUS
  Administered 2017-09-30: 150 ug via INTRAVENOUS

## 2017-09-30 MED ORDER — SUCCINYLCHOLINE CHLORIDE 200 MG/10ML IV SOSY
PREFILLED_SYRINGE | INTRAVENOUS | Status: DC | PRN
  Administered 2017-09-30: 160 mg via INTRAVENOUS

## 2017-09-30 MED ORDER — ONDANSETRON HCL 4 MG/2ML IJ SOLN
INTRAMUSCULAR | Status: AC
  Filled 2017-09-30: qty 4

## 2017-09-30 MED ORDER — BUPIVACAINE HCL (PF) 0.25 % IJ SOLN
INTRAMUSCULAR | Status: DC | PRN
Start: 2017-09-30 — End: 2017-09-30
  Administered 2017-09-30: 10 mL

## 2017-09-30 SURGICAL SUPPLY — 47 items
BUR ROUND FLUTED 4 SOFT TCH (BURR) ×1 IMPLANT
CANISTER SUCT 3000ML PPV (MISCELLANEOUS) ×2 IMPLANT
CLSR STERI-STRIP ANTIMIC 1/2X4 (GAUZE/BANDAGES/DRESSINGS) ×2 IMPLANT
COVER SURGICAL LIGHT HANDLE (MISCELLANEOUS) ×2 IMPLANT
DECANTER SPIKE VIAL GLASS SM (MISCELLANEOUS) ×2 IMPLANT
DRAPE HALF SHEET 40X57 (DRAPES) ×4 IMPLANT
DRAPE MICROSCOPE LEICA (MISCELLANEOUS) ×2 IMPLANT
DRAPE SURG 17X23 STRL (DRAPES) ×2 IMPLANT
DRSG MEPILEX BORDER 4X4 (GAUZE/BANDAGES/DRESSINGS) ×1 IMPLANT
DURAPREP 26ML APPLICATOR (WOUND CARE) ×2 IMPLANT
ELECT BLADE 4.0 EZ CLEAN MEGAD (MISCELLANEOUS) ×2
ELECT REM PT RETURN 9FT ADLT (ELECTROSURGICAL) ×2
ELECTRODE BLDE 4.0 EZ CLN MEGD (MISCELLANEOUS) IMPLANT
ELECTRODE REM PT RTRN 9FT ADLT (ELECTROSURGICAL) ×1 IMPLANT
FLOSEAL 10ML (HEMOSTASIS) ×1 IMPLANT
GAUZE SPONGE 4X4 12PLY STRL (GAUZE/BANDAGES/DRESSINGS) ×1 IMPLANT
GLOVE BIOGEL PI IND STRL 8 (GLOVE) ×2 IMPLANT
GLOVE BIOGEL PI INDICATOR 8 (GLOVE) ×2
GLOVE ORTHO TXT STRL SZ7.5 (GLOVE) ×4 IMPLANT
GOWN STRL REUS W/ TWL LRG LVL3 (GOWN DISPOSABLE) ×2 IMPLANT
GOWN STRL REUS W/ TWL XL LVL3 (GOWN DISPOSABLE) ×1 IMPLANT
GOWN STRL REUS W/TWL 2XL LVL3 (GOWN DISPOSABLE) ×2 IMPLANT
GOWN STRL REUS W/TWL LRG LVL3 (GOWN DISPOSABLE) ×4
GOWN STRL REUS W/TWL XL LVL3 (GOWN DISPOSABLE) ×2
KIT BASIN OR (CUSTOM PROCEDURE TRAY) ×2 IMPLANT
KIT ROOM TURNOVER OR (KITS) ×2 IMPLANT
MANIFOLD NEPTUNE II (INSTRUMENTS) ×2 IMPLANT
NDL HYPO 25GX1X1/2 BEV (NEEDLE) ×1 IMPLANT
NDL SPNL 18GX3.5 QUINCKE PK (NEEDLE) ×1 IMPLANT
NEEDLE HYPO 25GX1X1/2 BEV (NEEDLE) ×2 IMPLANT
NEEDLE SPNL 18GX3.5 QUINCKE PK (NEEDLE) ×2 IMPLANT
NS IRRIG 1000ML POUR BTL (IV SOLUTION) ×2 IMPLANT
PACK LAMINECTOMY ORTHO (CUSTOM PROCEDURE TRAY) ×2 IMPLANT
PAD ABD 8X10 STRL (GAUZE/BANDAGES/DRESSINGS) ×1 IMPLANT
PAD ARMBOARD 7.5X6 YLW CONV (MISCELLANEOUS) ×4 IMPLANT
PATTIES SURGICAL .5 X.5 (GAUZE/BANDAGES/DRESSINGS) IMPLANT
PATTIES SURGICAL .75X.75 (GAUZE/BANDAGES/DRESSINGS) ×1 IMPLANT
SPONGE LAP 18X18 X RAY DECT (DISPOSABLE) ×1 IMPLANT
SUT VIC AB 0 CT1 27 (SUTURE)
SUT VIC AB 0 CT1 27XBRD ANBCTR (SUTURE) IMPLANT
SUT VIC AB 1 CTX 36 (SUTURE) ×2
SUT VIC AB 1 CTX36XBRD ANBCTR (SUTURE) ×1 IMPLANT
SUT VIC AB 2-0 CT1 27 (SUTURE) ×2
SUT VIC AB 2-0 CT1 TAPERPNT 27 (SUTURE) ×1 IMPLANT
SUT VIC AB 3-0 X1 27 (SUTURE) ×2 IMPLANT
TOWEL OR 17X24 6PK STRL BLUE (TOWEL DISPOSABLE) ×2 IMPLANT
TOWEL OR 17X26 10 PK STRL BLUE (TOWEL DISPOSABLE) ×2 IMPLANT

## 2017-09-30 NOTE — Anesthesia Preprocedure Evaluation (Signed)
Anesthesia Evaluation  Patient identified by MRN, date of birth, ID band Patient awake    Reviewed: Allergy & Precautions, NPO status , Patient's Chart, lab work & pertinent test results  Airway Mallampati: II  TM Distance: >3 FB Neck ROM: Full    Dental   Pulmonary sleep apnea , Current Smoker,    Pulmonary exam normal        Cardiovascular hypertension, Pt. on medications Normal cardiovascular exam     Neuro/Psych Anxiety    GI/Hepatic GERD  Medicated and Controlled,  Endo/Other  diabetes, Type 2, Oral Hypoglycemic Agents  Renal/GU Renal InsufficiencyRenal disease     Musculoskeletal   Abdominal   Peds  Hematology   Anesthesia Other Findings   Reproductive/Obstetrics                             Anesthesia Physical Anesthesia Plan  ASA: III  Anesthesia Plan: General   Post-op Pain Management:    Induction: Intravenous  PONV Risk Score and Plan: 1  Airway Management Planned: Oral ETT  Additional Equipment:   Intra-op Plan:   Post-operative Plan: Extubation in OR  Informed Consent: I have reviewed the patients History and Physical, chart, labs and discussed the procedure including the risks, benefits and alternatives for the proposed anesthesia with the patient or authorized representative who has indicated his/her understanding and acceptance.     Plan Discussed with: CRNA and Surgeon  Anesthesia Plan Comments:         Anesthesia Quick Evaluation

## 2017-09-30 NOTE — Transfer of Care (Signed)
Immediate Anesthesia Transfer of Care Note  Patient: Randy [name]  Procedure(s) Performed: L2-3 LAMINECTOMY WITH MICRODISCECTOMY (N/A )  Patient Location: PACU  Anesthesia Type:General  Level of Consciousness: drowsy and patient cooperative  Airway & Oxygen Therapy: Patient Spontanous Breathing and Patient connected to nasal cannula oxygen  Post-op Assessment: Report given to RN, Post -op Vital signs reviewed and stable and Patient moving all extremities X 4  Post vital signs: Reviewed and stable  Last Vitals:  Vitals:   09/30/17 1234  BP: (!) 161/93  Pulse: 93  Resp: 18  Temp: 36.6 C  SpO2: 99%    Last Pain:  Vitals:   09/30/17 1234  TempSrc: Oral      Patients Stated Pain Goal: 3 (86/77/37 3668)  Complications: No apparent anesthesia complications

## 2017-09-30 NOTE — Op Note (Signed)
Preop diagnosis: L2-3 large central HNP with severe spinal stenosis and neurogenic claudication.  Postop diagnosis: Same  Procedure: L2 complete laminectomy, L2-3 decompression and bilateral microdiscectomy.  Surgeon: Rodell Perna MD  Assistant: Benjiman Core PA-C medically necessary and present for the entire procedure  EBL 517 cc  Complications: None  Anesthesia: General  Brief history 72 year old male with greater than 5 months history of lower extremity numbness and weakness with large HNP causing severe stenosis.  Patient is a veteran and had imaging studies done at the [place] system was referred to me for surgical treatment.  MRI scan showed large central HNP.  Procedure after standard prepping and draping after intubation preoperative Ancef prophylaxis patient was placed prone on chest rolls careful padding DuraPrep area squared with towels Betadine Steri-Drape applied and needle localization based on palpable mass landmarks with the spinal needle directly at the L2-3 interspace.  Subperiosteal dissection after midline incision was performed.  A timeout procedure had been completed for needle placement.  Subperiosteal dissection on the lamina self-retaining McCullough retractor was placed.  A Coker clamp was placed directly onto the spinous process and onto the lamina directly over the L2-3 disc space confirmed with a second lateral x-ray.  Lamina was thinned with the bur from operative microscope was used and lamina was thinned.  Thick chunks of ligament was removed.  Bone was removed in the lateral gutter so we could get around each side of the dura down to the floor.  This was visualized on both sides microdiscectomy was performed making passes with the small material.  And then the later regular pituitary.  There was adherent fragment ventral to the dura which was scarred down with palpation using a black nerve hook as well as ball-tip nerve hook and also dural separator titanium micro  instruments the undersurface of the dura showed fragment adherent to the dura with some bone pieces likely from endplate attachment with the disc rupture.  45 minutes to an hour was spent trying to catch a piece finally going from side to side with passive small piece was entered a fibrous capsule where the fragments are present in multiple pieces of disc were removed.  There was still some disc adherent anterior to the dura but there was complete decompression from pedicle to pedicle.  Using the dural separator palpation of the dura could still feel some fragments anterior but they were adherent and could not be separated.  Multiple pieces of disc had been removed.  There was epidural bleeding which were controlled with bipolar.  Some Surgi-Flo was used and then on closure #1 Vicryl interrupted and 2-0 Vicryl subtendinous and then closure with staples .  Patient was transferred to recovery room in stable condition.

## 2017-09-30 NOTE — Anesthesia Procedure Notes (Addendum)
Procedure Name: Intubation Date/Time: 09/30/2017 4:02 PM Performed by: Imagene Riches, CRNA Pre-anesthesia Checklist: Patient identified, Emergency Drugs available, Suction available and Patient being monitored Patient Re-evaluated:Patient Re-evaluated prior to induction Oxygen Delivery Method: Circle System Utilized Preoxygenation: Pre-oxygenation with 100% oxygen Induction Type: IV induction Ventilation: Mask ventilation without difficulty Laryngoscope Size: Miller and 3 Grade View: Grade I Tube type: Oral Tube size: 7.5 mm Number of attempts: 1 Airway Equipment and Method: Stylet and Oral airway Placement Confirmation: ETT inserted through vocal cords under direct vision,  positive ETCO2 and breath sounds checked- equal and bilateral Secured at: 23 cm Tube secured with: Tape Dental Injury: Teeth and Oropharynx as per pre-operative assessment  Comments: Pt has history of difficult airway. Glide scope available in room. Easy to mask ventilate. DL x1 with miller 3 provided grade 1 view of glottis.

## 2017-09-30 NOTE — H&P (Signed)
Randy [name] is an 72 y.o. male.   Chief Complaint: back pain and leg pain HPI: patient with hx of L2-3 HNP and above complaint presents for surgical intervention.  Progressively worsening symptoms.  Failed conservative treatment.   Past Medical History:  Diagnosis Date  . Anxiety    HAS  PTSD  . BPH (benign prostatic hypertrophy)   . Cancer ([employer])    PROSTATE  . Cataracts, bilateral   . CKD (chronic kidney disease), stage III ([place])   . Cough    FOR 1 YEAR WITH THICK WHITE PHLEGM  . Difficult intubation 2015   KEPT OVERNIGHT WITH LAST NECK SURGERY, TOLD TUBE WAS HARDER TO INSERT  . ED (erectile dysfunction)   . GERD (gastroesophageal reflux disease)   . H/O bladder problems   . Hepatitis    "yellow jaundice" -   . History of adenomatous polyp of colon   . History of condyloma acuminatum   . History of syncope    2003-- ? idiology--  per echo ef >55%  . Hypertension   . Insulin dependent type 2 diabetes mellitus ([place])   . Lumbar herniated disc    Pulposus  . Malfunction of penile prosthesis ([place])   . OA (osteoarthritis)    hips  . OSA on CPAP    severe OSA  per study 07-01-2002  (done at [facility])  occasionally uses cpap  . Peripheral neuropathy   . Pneumonia 2015  . PTSD (post-traumatic stress disorder)   . PTSD (post-traumatic stress disorder)   . Sleep apnea   . Spondylolysis of cervical region   . Wears glasses     Past Surgical History:  Procedure Laterality Date  . ANTERIOR CERVICAL DECOMP/DISCECTOMY FUSION N/A 03/04/2014   Procedure: ANTERIOR CERVICAL DECOMPRESSION/DISCECTOMY FUSION 2 LEVELS;  Surgeon: Ophelia Charter, MD;  Location: [facility];  Service: Neurosurgery;  Laterality: N/A;  C34 C45 anterior cervical decompression with fusion interbody prosthesis plating and bonegraft  . CARPAL TUNNEL RELEASE Right 03/2004  . CERVICAL FORAMENECTOMY  Nov 2005//   2000   right  C7 -- T1/   yr 2000 unknown level  . PENILE PROSTHESIS IMPLANT N/A 03/04/2015   Procedure:  REMOVAL AND REPLACMENT PENILE PROTHESIS INFLATABLE COLOPLAST INFRAPUBIC APPROACH;  Surgeon: Kathie Rhodes, MD;  Location: [facility];  Service: Urology;  Laterality: N/A;  . [place]  . REMOVAL CYST FROM CHEEK  Jan 2016   benign  . ROBOT ASSISTED LAPAROSCOPIC RADICAL PROSTATECTOMY N/A 04/11/2016   Procedure: XI ROBOTIC ASSISTED LAPAROSCOPIC RADICAL PROSTATECTOMY;  Surgeon: Ardis Hughs, MD;  Location: [facility];  Service: Urology;  Laterality: N/A;    Family History  Problem Relation Age of Onset  . Stroke Mother   . Stroke Father    Social History:  reports that he has been smoking cigarettes.  He has a 25.50 pack-year smoking history. he has never used smokeless tobacco. He reports that he does not drink alcohol or use drugs.  Allergies:  Allergies  Allergen Reactions  . Cetirizine & Related Other (See Comments)    Cotton mouth and trouble swallowing     Medications Prior to Admission  Medication Sig Dispense Refill  . allopurinol (ZYLOPRIM) 100 MG tablet Take 100 mg by mouth at bedtime.    Marland Kitchen atorvastatin (LIPITOR) 40 MG tablet Take 40 mg by mouth at bedtime.     . carboxymethylcellulose 1 % ophthalmic solution 1 drop 4 (four) times daily as needed (dry eyes).     Marland Kitchen  cyclobenzaprine (FLEXERIL) 10 MG tablet Take 10 mg by mouth daily as needed for muscle spasms.     . cycloSPORINE (RESTASIS) 0.05 % ophthalmic emulsion Place 1 drop into both eyes 2 (two) times daily as needed (dry eyes).     . furosemide (LASIX) 20 MG tablet Take 20 mg by mouth daily.    Marland Kitchen guaiFENesin (ROBITUSSIN) 100 MG/5ML SOLN Take 5 mLs by mouth 2 (two) times daily.     . insulin NPH-regular Human (NOVOLIN 70/30) (70-30) 100 UNIT/ML injection Inject 50-65 Units into the skin 2 (two) times daily. Dose according to BS reading. Check three times daily. 50 units in the morning and 65 units in evening    . lisinopril (PRINIVIL,ZESTRIL) 40 MG tablet Take 20 mg by mouth every  morning. Takes 0.5 tablet    . Multiple Vitamins-Minerals (CENTRUM SILVER 50+MEN) TABS Take 1 tablet by mouth daily.    Marland Kitchen oxybutynin (DITROPAN) 5 MG tablet Take 5 mg by mouth 2 (two) times daily.    . polyethylene glycol powder (GLYCOLAX/MIRALAX) powder Take 17 g by mouth daily.    . traMADol (ULTRAM) 50 MG tablet Take 2 tablets (100 mg total) by mouth every 6 (six) hours as needed. (Patient taking differently: Take 100 mg by mouth every 6 (six) hours as needed. ) 30 tablet 0  . dextrose (GLUTOSE) 40 % GEL Take 1 Tube by mouth as needed for low blood sugar.      Results for orders placed or performed during the hospital encounter of 09/30/17 (from the past 48 hour(s))  Glucose, capillary     Status: Abnormal   Collection Time: 09/30/17 12:37 PM  Result Value Ref Range   Glucose-Capillary 103 (H) 65 - 99 mg/dL  CBC     Status: None   Collection Time: 09/30/17 12:54 PM  Result Value Ref Range   WBC 10.3 4.0 - 10.5 K/uL   RBC 5.18 4.22 - 5.81 MIL/uL   Hemoglobin 14.9 13.0 - 17.0 g/dL   HCT 44.1 39.0 - 52.0 %   MCV 85.1 78.0 - 100.0 fL   MCH 28.8 26.0 - 34.0 pg   MCHC 33.8 30.0 - 36.0 g/dL   RDW 14.7 11.5 - 15.5 %   Platelets 165 150 - 400 K/uL    Comment: Performed at [facility], [place] [address], [place], [place] [address]  Comprehensive metabolic panel     Status: Abnormal   Collection Time: 09/30/17 12:54 PM  Result Value Ref Range   Sodium 138 135 - 145 mmol/L   Potassium 4.2 3.5 - 5.1 mmol/L   Chloride 106 101 - 111 mmol/L   CO2 20 (L) 22 - 32 mmol/L   Glucose, Bld 101 (H) 65 - 99 mg/dL   BUN 16 6 - 20 mg/dL   Creatinine, Ser 1.71 (H) 0.61 - 1.24 mg/dL   Calcium 9.4 8.9 - 10.3 mg/dL   Total Protein 7.1 6.5 - 8.1 g/dL   Albumin 3.6 3.5 - 5.0 g/dL   AST 23 15 - 41 U/L   ALT 20 17 - 63 U/L   Alkaline Phosphatase 81 38 - 126 U/L   Total Bilirubin 0.7 0.3 - 1.2 mg/dL   GFR calc non Af Amer 38 (L) >60 mL/min   GFR calc Af Amer 45 (L) >60 mL/min    Comment: (NOTE) The  eGFR has been calculated using the CKD EPI equation. This calculation has not been validated in all clinical situations. eGFR's persistently <60 mL/min  signify possible Chronic Kidney Disease.    Anion gap 12 5 - 15    Comment: Performed at [facility] [address] [place], [place] [address]  Hemoglobin A1c     Status: Abnormal   Collection Time: 09/30/17 12:54 PM  Result Value Ref Range   Hgb A1c MFr Bld 6.6 (H) 4.8 - 5.6 %    Comment: (NOTE) Pre diabetes:          5.7%-6.4% Diabetes:              >6.4% Glycemic control for   <7.0% adults with diabetes    Mean Plasma Glucose 142.72 mg/dL    Comment: Performed at [facility] [address], [place], [place] [address]  Urinalysis, Routine w reflex microscopic     Status: None   Collection Time: 09/30/17 12:55 PM  Result Value Ref Range   Color, Urine YELLOW YELLOW   APPearance CLEAR CLEAR   Specific Gravity, Urine 1.013 1.005 - 1.030   pH 6.0 5.0 - 8.0   Glucose, UA NEGATIVE NEGATIVE mg/dL   Hgb urine dipstick NEGATIVE NEGATIVE   Bilirubin Urine NEGATIVE NEGATIVE   Ketones, ur NEGATIVE NEGATIVE mg/dL   Protein, ur NEGATIVE NEGATIVE mg/dL   Nitrite NEGATIVE NEGATIVE   Leukocytes, UA NEGATIVE NEGATIVE    Comment: Performed at [facility] [address] [place], [place] [address]   No results found.  Review of Systems  Constitutional: Negative.   HENT: Negative.   Respiratory: Negative.   Cardiovascular: Negative.   Gastrointestinal: Negative.   Genitourinary: Negative.   Musculoskeletal: Positive for back pain.  Skin: Negative.   Neurological: Positive for tingling.  Psychiatric/Behavioral: Negative.     Blood pressure (!) 161/93, pulse 93, temperature 97.9 F (36.6 C), temperature source Oral, resp. rate 18, height '5\' 7"'$  (1.702 m), weight 204 lb (92.5 kg), SpO2 99 %. Physical Exam  Constitutional: He is oriented to person, place, and time. He appears well-developed. He appears distressed.   HENT:  Head: Normocephalic and atraumatic.  Eyes: Pupils are equal, round, and reactive to light.  Neck: Normal range of motion.  Respiratory: No respiratory distress.  GI: He exhibits no distension.  Musculoskeletal: He exhibits tenderness.  Neurological: He is alert and oriented to person, place, and time.  Skin: Skin is warm.  Psychiatric: He has a normal mood and affect.     Assessment/Plan L2-3 HNP  Will proceed with L2-3 LAMINECTOMY WITH MICRODISCECTOMY as scheduled.  Surgical procedure discussed.  All questions answered,    Benjiman Core, PA-C 09/30/2017, 3:49 PM

## 2017-10-01 ENCOUNTER — Encounter (HOSPITAL_COMMUNITY): Payer: Self-pay | Admitting: Orthopaedic Surgery

## 2017-10-01 DIAGNOSIS — M5126 Other intervertebral disc displacement, lumbar region: Secondary | ICD-10-CM | POA: Diagnosis not present

## 2017-10-01 LAB — BASIC METABOLIC PANEL
Anion gap: 11 (ref 5–15)
BUN: 17 mg/dL (ref 6–20)
CHLORIDE: 102 mmol/L (ref 101–111)
CO2: 22 mmol/L (ref 22–32)
CREATININE: 1.78 mg/dL — AB (ref 0.61–1.24)
Calcium: 9.2 mg/dL (ref 8.9–10.3)
GFR calc non Af Amer: 37 mL/min — ABNORMAL LOW (ref >60)
GFR, EST AFRICAN AMERICAN: 43 mL/min — AB (ref >60)
Glucose, Bld: 188 mg/dL — ABNORMAL HIGH (ref 65–99)
POTASSIUM: 5.1 mmol/L (ref 3.5–5.1)
SODIUM: 135 mmol/L (ref 135–145)

## 2017-10-01 LAB — GLUCOSE, CAPILLARY: GLUCOSE-CAPILLARY: 195 mg/dL — AB (ref 65–99)

## 2017-10-01 MED ORDER — OXYCODONE-ACETAMINOPHEN 5-325 MG PO TABS: 1.0000 | ORAL_TABLET | Freq: Four times a day (QID) | ORAL | Status: DC | PRN

## 2017-10-01 MED ORDER — OXYCODONE-ACETAMINOPHEN 5-325 MG PO TABS: 1.0000 | ORAL_TABLET | Freq: Four times a day (QID) | ORAL | 0 refills | Status: DC | PRN

## 2017-10-01 NOTE — Progress Notes (Signed)
   Subjective: 1 Day Post-Op Procedure(s) (LRB): L2-3 LAMINECTOMY WITH MICRODISCECTOMY (N/A) Patient reports pain as mild.    Objective: Vital signs in last 24 hours: Temp:  [97.3 F (36.3 C)-98.4 F (36.9 C)] 98.1 F (36.7 C) (02/05 0400) Pulse Rate:  [75-96] 88 (02/05 0400) Resp:  [0-20] 18 (02/05 0400) BP: (117-161)/(74-96) 117/76 (02/05 0400) SpO2:  [89 %-100 %] 99 % (02/05 0400) Weight:  [204 lb (92.5 kg)] 204 lb (92.5 kg) (02/04 1234)  Intake/Output from previous day: 02/04 0701 - 02/05 0700 In: 1050 [I.V.:800; IV Piggyback:250] Out: 300 [Blood:300] Intake/Output this shift: No intake/output data recorded.  Recent Labs    09/30/17 1254  HGB 14.9   Recent Labs    09/30/17 1254  WBC 10.3  RBC 5.18  HCT 44.1  PLT 165   Recent Labs    09/30/17 1254 10/01/17 0523  NA 138 135  K 4.2 5.1  CL 106 102  CO2 20* 22  BUN 16 17  CREATININE 1.71* 1.78*  GLUCOSE 101* 188*  CALCIUM 9.4 9.2   No results for input(s): LABPT, INR in the last 72 hours.  Neurologically intact Dg Lumbar Spine 2-3 Views  Result Date: 09/30/2017 CLINICAL DATA:  Lumbar localization images for L2-L3 laminectomy EXAM: LUMBAR SPINE - 2-3 VIEW COMPARISON:  Portable cross-table lateral intraoperative images of the lumbar spine compared to 06/27/2011 FINDINGS: Exam is labeled with 5 lumbar vertebra. Prior lumbar radiographs from 2012 demonstrate 5 non-rib-bearing lumbar type vertebra. Correlation of this numbering system with that used on any prior outside imaging the patient may have is recommended to ensure consistency. Image #1 at 8250 hours: Metallic probe via dorsal approach projects dorsal to the L2-L3 disc space level. Image #2 at 1617 hours: Surgical instruments via dorsal approach project dorsal to the L2-L3 disc space level. IMPRESSION: Intraoperative localization of the L2-L3 disc space level (with lumbar numbering as above). Electronically Signed   By: Lavonia Dana M.D.   On: 09/30/2017 19:23     Assessment/Plan: 1 Day Post-Op Procedure(s) (LRB): L2-3 LAMINECTOMY WITH MICRODISCECTOMY (N/A) Plan ; discharge home.   Randy [name] 10/01/2017, 7:30 AM

## 2017-10-01 NOTE — Progress Notes (Signed)
Patient alert and oriented, mae's well, voiding adequate amount of urine, swallowing without difficulty, no c/o pain at time of discharge. Patient discharged home with family. Script and discharged instructions given to patient. Patient and family stated understanding of instructions given. Patient has an appointment with Dr. Yates  

## 2017-10-01 NOTE — Anesthesia Postprocedure Evaluation (Signed)
Anesthesia Post Note  Patient: Randy [name]  Procedure(s) Performed: L2-3 LAMINECTOMY WITH MICRODISCECTOMY (N/A )     Patient location during evaluation: PACU Anesthesia Type: General Level of consciousness: awake and alert Pain management: pain level controlled Vital Signs Assessment: post-procedure vital signs reviewed and stable Respiratory status: spontaneous breathing, nonlabored ventilation, respiratory function stable and patient connected to nasal cannula oxygen Cardiovascular status: blood pressure returned to baseline and stable Postop Assessment: no apparent nausea or vomiting Anesthetic complications: no    Last Vitals:  Vitals:   10/01/17 0400 10/01/17 0754  BP: 117/76 126/67  Pulse: 88 79  Resp: 18 18  Temp: 36.7 C 36.6 C  SpO2: 99% 100%    Last Pain:  Vitals:   10/01/17 0448  TempSrc:   PainSc: 3                  Jariah Tarkowski DAVID

## 2017-10-01 NOTE — Discharge Instructions (Signed)
Walk daily. Follow up with Benjiman Core PA-C as scheduled.

## 2017-10-02 ENCOUNTER — Telehealth (INDEPENDENT_AMBULATORY_CARE_PROVIDER_SITE_OTHER): Payer: Self-pay | Admitting: Orthopaedic Surgery

## 2017-10-02 ENCOUNTER — Ambulatory Visit (INDEPENDENT_AMBULATORY_CARE_PROVIDER_SITE_OTHER): Payer: Self-pay | Admitting: Orthopaedic Surgery

## 2017-10-02 NOTE — Telephone Encounter (Signed)
09/30/2017 OP Note faxed to [facility], [place]: Mardene Celeste [phone]. Ph [phone], ext [phone]

## 2017-10-08 NOTE — Discharge Summary (Signed)
Patient ID: Randy [name] MRN: [id number] DOB/AGE: 72-Mar-1947 72 y.o.  Admit date: 09/30/2017 Discharge date: 10/08/2017  Admission Diagnoses:  Active Problems:   HNP (herniated nucleus pulposus), lumbar   Discharge Diagnoses:  Active Problems:   HNP (herniated nucleus pulposus), lumbar  status post Procedure(s): L2-3 LAMINECTOMY WITH MICRODISCECTOMY  Past Medical History:  Diagnosis Date  . Anxiety    HAS  PTSD  . BPH (benign prostatic hypertrophy)   . Cancer ([place])    PROSTATE  . Cataracts, bilateral   . CKD (chronic kidney disease), stage III ([place])   . Cough    FOR 1 YEAR WITH THICK WHITE PHLEGM  . Difficult intubation 2015   KEPT OVERNIGHT WITH LAST NECK SURGERY, TOLD TUBE WAS HARDER TO INSERT  . ED (erectile dysfunction)   . GERD (gastroesophageal reflux disease)   . H/O bladder problems   . Hepatitis    "yellow jaundice" -   . History of adenomatous polyp of colon   . History of condyloma acuminatum   . History of syncope    2003-- ? idiology--  per echo ef >55%  . Hypertension   . Insulin dependent type 2 diabetes mellitus ([place])   . Lumbar herniated disc    Pulposus  . Malfunction of penile prosthesis ([place])   . OA (osteoarthritis)    hips  . OSA on CPAP    severe OSA  per study 07-01-2002  (done at [facility])  occasionally uses cpap  . Peripheral neuropathy   . Pneumonia 2015  . PTSD (post-traumatic stress disorder)   . PTSD (post-traumatic stress disorder)   . Sleep apnea   . Spondylolysis of cervical region   . Wears glasses     Surgeries: Procedure(s): L2-3 LAMINECTOMY WITH MICRODISCECTOMY on 09/30/2017   Consultants:   Discharged Condition: Improved  Hospital Course: Randy [name] is an 72 y.o. male who was admitted 09/30/2017 for operative treatment of lumbar stenosis. Patient failed conservative treatments (please see the history and physical for the specifics) and had severe unremitting pain that affects sleep, daily activities and  work/hobbies. After pre-op clearance, the patient was taken to the operating room on 09/30/2017 and underwent  Procedure(s): L2-3 LAMINECTOMY WITH MICRODISCECTOMY.    Patient was given perioperative antibiotics:  Anti-infectives (From admission, onward)   Start     Dose/Rate Route Frequency Ordered Stop   09/30/17 2200  ceFAZolin (ANCEF) IVPB 1 g/50 mL premix     1 g 100 mL/hr over 30 Minutes Intravenous Every 8 hours 09/30/17 2026 09/30/17 2307   09/30/17 1430  ceFAZolin (ANCEF) IVPB 2g/100 mL premix     2 g 200 mL/hr over 30 Minutes Intravenous On call to O.R. 09/30/17 1218 09/30/17 1618       Patient was given sequential compression devices and early ambulation to prevent DVT.   Patient benefited maximally from hospital stay and there were no complications. At the time of discharge, the patient was urinating/moving their bowels without difficulty, tolerating a regular diet, pain is controlled with oral pain medications and they have been cleared by PT/OT.   Recent vital signs: No data found.   Recent laboratory studies: No results for input(s): WBC, HGB, HCT, PLT, NA, K, CL, CO2, BUN, CREATININE, GLUCOSE, INR, CALCIUM in the last 72 hours.  Invalid input(s): PT, 2   Discharge Medications:   Allergies as of 10/01/2017      Reactions   Cetirizine & Related Other (See Comments)   [place]ton mouth and trouble swallowing  Medication List    STOP taking these medications   traMADol 50 MG tablet Commonly known as:  ULTRAM     TAKE these medications   allopurinol 100 MG tablet Commonly known as:  ZYLOPRIM Take 100 mg by mouth at bedtime.   atorvastatin 40 MG tablet Commonly known as:  LIPITOR Take 40 mg by mouth at bedtime.   carboxymethylcellulose 1 % ophthalmic solution 1 drop 4 (four) times daily as needed (dry eyes).   CENTRUM SILVER 50+MEN Tabs Take 1 tablet by mouth daily.   cyclobenzaprine 10 MG tablet Commonly known as:  FLEXERIL Take 10 mg by mouth daily as  needed for muscle spasms.   cycloSPORINE 0.05 % ophthalmic emulsion Commonly known as:  RESTASIS Place 1 drop into both eyes 2 (two) times daily as needed (dry eyes).   dextrose 40 % Gel Commonly known as:  GLUTOSE Take 1 Tube by mouth as needed for low blood sugar.   furosemide 20 MG tablet Commonly known as:  LASIX Take 20 mg by mouth daily.   guaiFENesin 100 MG/5ML Soln Commonly known as:  ROBITUSSIN Take 5 mLs by mouth 2 (two) times daily.   insulin NPH-regular Human (70-30) 100 UNIT/ML injection Commonly known as:  NOVOLIN 70/30 Inject 50-65 Units into the skin 2 (two) times daily. Dose according to BS reading. Check three times daily. 50 units in the morning and 65 units in evening   lisinopril 40 MG tablet Commonly known as:  PRINIVIL,ZESTRIL Take 20 mg by mouth every morning. Takes 0.5 tablet   oxybutynin 5 MG tablet Commonly known as:  DITROPAN Take 5 mg by mouth 2 (two) times daily.   oxyCODONE-acetaminophen 5-325 MG tablet Commonly known as:  PERCOCET/ROXICET Take 1-2 tablets by mouth every 6 (six) hours as needed (prn pain).   polyethylene glycol powder powder Commonly known as:  GLYCOLAX/MIRALAX Take 17 g by mouth daily.            Discharge Care Instructions  (From admission, onward)        Start     Ordered   10/01/17 0000  Change dressing     10/01/17 0736      Diagnostic Studies: Dg Lumbar Spine 2-3 Views  Result Date: 09/30/2017 CLINICAL DATA:  Lumbar localization images for L2-L3 laminectomy EXAM: LUMBAR SPINE - 2-3 VIEW COMPARISON:  Portable cross-table lateral intraoperative images of the lumbar spine compared to 06/27/2011 FINDINGS: Exam is labeled with 5 lumbar vertebra. Prior lumbar radiographs from 2012 demonstrate 5 non-rib-bearing lumbar type vertebra. Correlation of this numbering system with that used on any prior outside imaging the patient may have is recommended to ensure consistency. Image #1 at 3762 hours: Metallic probe via  dorsal approach projects dorsal to the L2-L3 disc space level. Image #2 at 1617 hours: Surgical instruments via dorsal approach project dorsal to the L2-L3 disc space level. IMPRESSION: Intraoperative localization of the L2-L3 disc space level (with lumbar numbering as above). Electronically Signed   By: Lavonia Dana M.D.   On: 09/30/2017 19:23   Xr Cervical Spine 2 Or 3 Views  Result Date: 09/19/2017 AP and lateral spine x-rays demonstrate 2 level cervical fusion from C5 with solid fusion.  Ankylosis below this level and previous. Impression: C3-C5 fusion, solid, 2 level plate and screws satisfactory position  Xr Lumbar Spine 2-3 Views  Result Date: 09/19/2017 AP lateral lumbar x-rays 5 lumbar vertebrae without curvature.  Calcification of the abdominal aorta.  Slight narrowing L2-3 disc without listhesis. Impression:  Lumbar x-rays negative for acute changes.  Slight narrowing L2-3 disc space.   Discharge Instructions    Change dressing   Complete by:  As directed         Discharge Plan:  discharge to home  Disposition:     Signed: Benjiman Core for Rodell Perna MD 10/08/2017, 3:58 PM

## 2017-10-09 ENCOUNTER — Encounter (INDEPENDENT_AMBULATORY_CARE_PROVIDER_SITE_OTHER): Payer: Self-pay | Admitting: Orthopaedic Surgery

## 2017-10-17 ENCOUNTER — Ambulatory Visit (INDEPENDENT_AMBULATORY_CARE_PROVIDER_SITE_OTHER): Payer: No Typology Code available for payment source | Admitting: Orthopaedic Surgery

## 2017-10-17 ENCOUNTER — Encounter (INDEPENDENT_AMBULATORY_CARE_PROVIDER_SITE_OTHER): Payer: Self-pay | Admitting: Orthopaedic Surgery

## 2017-10-17 VITALS — BP 105/70 | HR 115

## 2017-10-17 DIAGNOSIS — M5126 Other intervertebral disc displacement, lumbar region: Secondary | ICD-10-CM

## 2017-10-17 NOTE — Progress Notes (Signed)
Post-Op Visit Note   Patient: Randy [name]           Date of Birth: Mar 31, 1946           MRN: [id number] Visit Date: 10/17/2017 PCP: Monico Blitz, MD   Assessment & Plan: Follow-up L2-3 laminectomy microdiscectomy.  He states he is not taking any pain medication did not need to get it filled his preop pain is gone he is happy with the surgical result.  Steri-Strips applied incision looks good  Chief Complaint:  Chief Complaint  Patient presents with  . Lower Back - Routine Post Op   Visit Diagnoses:  1. HNP (herniated nucleus pulposus), lumbar     Plan: Return in 1 month for final visit continue walking program.  Follow-Up Instructions: Return in about 1 month (around 11/14/2017).   Orders:  No orders of the defined types were placed in this encounter.  No orders of the defined types were placed in this encounter.   Imaging: No results found.  PMFS History: Patient Active Problem List   Diagnosis Date Noted  . HNP (herniated nucleus pulposus), lumbar 09/30/2017  . Prostate cancer ([place]) 04/11/2016  . Cervical spondylosis with myelopathy 03/04/2014   Past Medical History:  Diagnosis Date  . Anxiety    HAS  PTSD  . BPH (benign prostatic hypertrophy)   . Cancer ([place])    PROSTATE  . Cataracts, bilateral   . CKD (chronic kidney disease), stage III ([place])   . Cough    FOR 1 YEAR WITH THICK WHITE PHLEGM  . Difficult intubation 2015   KEPT OVERNIGHT WITH LAST NECK SURGERY, TOLD TUBE WAS HARDER TO INSERT  . ED (erectile dysfunction)   . GERD (gastroesophageal reflux disease)   . H/O bladder problems   . Hepatitis    "yellow jaundice" -   . History of adenomatous polyp of colon   . History of condyloma acuminatum   . History of syncope    2003-- ? idiology--  per echo ef >55%  . Hypertension   . Insulin dependent type 2 diabetes mellitus ([place])   . Lumbar herniated disc    Pulposus  . Malfunction of penile prosthesis ([place])   . OA (osteoarthritis)    hips    . OSA on CPAP    severe OSA  per study 07-01-2002  (done at [facility])  occasionally uses cpap  . Peripheral neuropathy   . Pneumonia 2015  . PTSD (post-traumatic stress disorder)   . PTSD (post-traumatic stress disorder)   . Sleep apnea   . Spondylolysis of cervical region   . Wears glasses     Family History  Problem Relation Age of Onset  . Stroke Mother   . Stroke Father     Past Surgical History:  Procedure Laterality Date  . ANTERIOR CERVICAL DECOMP/DISCECTOMY FUSION N/A 03/04/2014   Procedure: ANTERIOR CERVICAL DECOMPRESSION/DISCECTOMY FUSION 2 LEVELS;  Surgeon: Ophelia Charter, MD;  Location: [facility];  Service: Neurosurgery;  Laterality: N/A;  C34 C45 anterior cervical decompression with fusion interbody prosthesis plating and bonegraft  . CARPAL TUNNEL RELEASE Right 03/2004  . CERVICAL FORAMENECTOMY  Nov 2005//   2000   right  C7 -- T1/   yr 2000 unknown level  . LUMBAR LAMINECTOMY/DECOMPRESSION MICRODISCECTOMY N/A 09/30/2017   Procedure: L2-3 LAMINECTOMY WITH MICRODISCECTOMY;  Surgeon: Marybelle Killings, MD;  Location: [facility];  Service: Orthopedics;  Laterality: N/A;  . PENILE PROSTHESIS IMPLANT N/A 03/04/2015   Procedure: REMOVAL AND REPLACMENT  PENILE PROTHESIS INFLATABLE COLOPLAST INFRAPUBIC APPROACH;  Surgeon: Kathie Rhodes, MD;  Location: [facility];  Service: Urology;  Laterality: N/A;  . [place]  . REMOVAL CYST FROM CHEEK  Jan 2016   benign  . ROBOT ASSISTED LAPAROSCOPIC RADICAL PROSTATECTOMY N/A 04/11/2016   Procedure: XI ROBOTIC ASSISTED LAPAROSCOPIC RADICAL PROSTATECTOMY;  Surgeon: Ardis Hughs, MD;  Location: [facility];  Service: Urology;  Laterality: N/A;   Social History   Occupational History  . Not on file  Tobacco Use  . Smoking status: Current Every Day Smoker    Packs/day: 0.50    Years: 51.00    Pack years: 25.50    Types: Cigarettes  . Smokeless tobacco: Never Used  Substance and Sexual Activity  . Alcohol use:  No    Frequency: Never    Comment: very  rare   moonshine  . Drug use: No  . Sexual activity: Not on file

## 2017-11-12 ENCOUNTER — Encounter (INDEPENDENT_AMBULATORY_CARE_PROVIDER_SITE_OTHER): Payer: Self-pay | Admitting: Orthopaedic Surgery

## 2017-11-21 ENCOUNTER — Inpatient Hospital Stay (INDEPENDENT_AMBULATORY_CARE_PROVIDER_SITE_OTHER): Payer: No Typology Code available for payment source | Admitting: Orthopaedic Surgery

## 2019-02-28 ENCOUNTER — Encounter (HOSPITAL_COMMUNITY): Payer: Self-pay | Admitting: Emergency Medicine

## 2019-02-28 ENCOUNTER — Other Ambulatory Visit: Payer: Self-pay

## 2019-02-28 ENCOUNTER — Emergency Department (HOSPITAL_COMMUNITY): Payer: No Typology Code available for payment source

## 2019-02-28 ENCOUNTER — Emergency Department (HOSPITAL_COMMUNITY)
Admission: EM | Admit: 2019-02-28 | Discharge: 2019-02-28 | Disposition: A | Discharge status: Home / Self Care | Payer: No Typology Code available for payment source | Attending: Emergency Medicine | Admitting: Emergency Medicine

## 2019-02-28 DIAGNOSIS — N183 Chronic kidney disease, stage 3 (moderate): Secondary | ICD-10-CM | POA: Insufficient documentation

## 2019-02-28 DIAGNOSIS — E1122 Type 2 diabetes mellitus with diabetic chronic kidney disease: Secondary | ICD-10-CM | POA: Diagnosis not present

## 2019-02-28 DIAGNOSIS — E1142 Type 2 diabetes mellitus with diabetic polyneuropathy: Secondary | ICD-10-CM | POA: Insufficient documentation

## 2019-02-28 DIAGNOSIS — Z794 Long term (current) use of insulin: Secondary | ICD-10-CM | POA: Insufficient documentation

## 2019-02-28 DIAGNOSIS — F17210 Nicotine dependence, cigarettes, uncomplicated: Secondary | ICD-10-CM | POA: Diagnosis not present

## 2019-02-28 DIAGNOSIS — Z79899 Other long term (current) drug therapy: Secondary | ICD-10-CM | POA: Insufficient documentation

## 2019-02-28 DIAGNOSIS — M25512 Pain in left shoulder: Secondary | ICD-10-CM | POA: Insufficient documentation

## 2019-02-28 DIAGNOSIS — I129 Hypertensive chronic kidney disease with stage 1 through stage 4 chronic kidney disease, or unspecified chronic kidney disease: Secondary | ICD-10-CM | POA: Insufficient documentation

## 2019-02-28 MED ORDER — LIDOCAINE 5 % EX PTCH
1.0000 | MEDICATED_PATCH | CUTANEOUS | Status: DC
  Administered 2019-02-28: 1 via TRANSDERMAL
  Filled 2019-02-28: qty 1

## 2019-02-28 MED ORDER — DICLOFENAC SODIUM 1 % TD GEL: 2.0000 g | Freq: Two times a day (BID) | TRANSDERMAL | 0 refills | Status: AC

## 2019-02-28 MED ORDER — LIDOCAINE 5 % EX PTCH: 1.0000 | MEDICATED_PATCH | CUTANEOUS | 0 refills | Status: DC

## 2019-02-28 NOTE — ED Provider Notes (Signed)
[facility] EMERGENCY DEPARTMENT Provider Note   CSN: [id number] Arrival date & time: 02/28/19  1701    History   Chief Complaint Chief Complaint  Patient presents with  . Shoulder Injury    HPI Randy [name] is a 73 y.o. male.     The history is provided by the patient.  Shoulder Pain Location:  Shoulder Shoulder location:  L shoulder Injury: yes   Time since incident:  2 weeks Pain details:    Quality:  Aching and dull   Radiates to:  Does not radiate   Severity:  Mild   Onset quality:  Gradual   Timing:  Intermittent   Progression:  Waxing and waning Relieved by:  Arthritis medications Worsened by:  Movement Associated symptoms: decreased range of motion and stiffness   Associated symptoms: no back pain, no fatigue, no fever, no muscle weakness, no neck pain, no swelling and no tingling     Past Medical History:  Diagnosis Date  . Anxiety    HAS  PTSD  . BPH (benign prostatic hypertrophy)   . Cancer ([place])    PROSTATE  . Cataracts, bilateral   . CKD (chronic kidney disease), stage III ([place])   . Cough    FOR 1 YEAR WITH THICK WHITE PHLEGM  . Difficult intubation 2015   KEPT OVERNIGHT WITH LAST NECK SURGERY, TOLD TUBE WAS HARDER TO INSERT  . ED (erectile dysfunction)   . GERD (gastroesophageal reflux disease)   . H/O bladder problems   . Hepatitis    "yellow jaundice" -   . History of adenomatous polyp of colon   . History of condyloma acuminatum   . History of syncope    2003-- ? idiology--  per echo ef >55%  . Hypertension   . Insulin dependent type 2 diabetes mellitus ([place])   . Lumbar herniated disc    Pulposus  . Malfunction of penile prosthesis ([place])   . OA (osteoarthritis)    hips  . OSA on CPAP    severe OSA  per study 07-01-2002  (done at [facility])  occasionally uses cpap  . Peripheral neuropathy   . Pneumonia 2015  . PTSD (post-traumatic stress disorder)   . PTSD (post-traumatic stress disorder)   . Sleep apnea   .  Spondylolysis of cervical region   . Wears glasses     Patient Active Problem List   Diagnosis Date Noted  . HNP (herniated nucleus pulposus), lumbar 09/30/2017  . Prostate cancer ([place]) 04/11/2016  . Cervical spondylosis with myelopathy 03/04/2014    Past Surgical History:  Procedure Laterality Date  . ANTERIOR CERVICAL DECOMP/DISCECTOMY FUSION N/A 03/04/2014   Procedure: ANTERIOR CERVICAL DECOMPRESSION/DISCECTOMY FUSION 2 LEVELS;  Surgeon: Ophelia Charter, MD;  Location: [facility];  Service: Neurosurgery;  Laterality: N/A;  C34 C45 anterior cervical decompression with fusion interbody prosthesis plating and bonegraft  . CARPAL TUNNEL RELEASE Right 03/2004  . CERVICAL FORAMENECTOMY  Nov 2005//   2000   right  C7 -- T1/   yr 2000 unknown level  . LUMBAR LAMINECTOMY/DECOMPRESSION MICRODISCECTOMY N/A 09/30/2017   Procedure: L2-3 LAMINECTOMY WITH MICRODISCECTOMY;  Surgeon: Marybelle Killings, MD;  Location: [facility];  Service: Orthopedics;  Laterality: N/A;  . PENILE PROSTHESIS IMPLANT N/A 03/04/2015   Procedure: REMOVAL AND REPLACMENT PENILE PROTHESIS INFLATABLE COLOPLAST INFRAPUBIC APPROACH;  Surgeon: Kathie Rhodes, MD;  Location: [facility];  Service: Urology;  Laterality: N/A;  . [place]  . REMOVAL  CYST FROM CHEEK  Jan 2016   benign  . ROBOT ASSISTED LAPAROSCOPIC RADICAL PROSTATECTOMY N/A 04/11/2016   Procedure: XI ROBOTIC ASSISTED LAPAROSCOPIC RADICAL PROSTATECTOMY;  Surgeon: Ardis Hughs, MD;  Location: [facility];  Service: Urology;  Laterality: N/A;        Home Medications    Prior to Admission medications   Medication Sig Start Date End Date Taking? Authorizing Provider  allopurinol (ZYLOPRIM) 100 MG tablet Take 100 mg by mouth at bedtime.    [provider]  atorvastatin (LIPITOR) 40 MG tablet Take 40 mg by mouth at bedtime.     [provider]  carboxymethylcellulose 1 % ophthalmic solution 1 drop 4 (four) times daily as  needed (dry eyes).     [provider]  cyclobenzaprine (FLEXERIL) 10 MG tablet Take 10 mg by mouth daily as needed for muscle spasms.     [provider]  cycloSPORINE (RESTASIS) 0.05 % ophthalmic emulsion Place 1 drop into both eyes 2 (two) times daily as needed (dry eyes).     [provider]  dextrose (GLUTOSE) 40 % GEL Take 1 Tube by mouth as needed for low blood sugar.    [provider]  diclofenac sodium (VOLTAREN) 1 % GEL Apply 2 g topically 2 (two) times a day. 02/28/19 03/30/19  Nilah Belcourt, DO  furosemide (LASIX) 20 MG tablet Take 20 mg by mouth daily.    [provider]  guaiFENesin (ROBITUSSIN) 100 MG/5ML SOLN Take 5 mLs by mouth 2 (two) times daily.     [provider]  insulin NPH-regular Human (NOVOLIN 70/30) (70-30) 100 UNIT/ML injection Inject 50-65 Units into the skin 2 (two) times daily. Dose according to BS reading. Check three times daily. 50 units in the morning and 65 units in evening    [provider]  lidocaine (LIDODERM) 5 % Place 1 patch onto the skin daily. Remove & Discard patch within 12 hours or as directed by MD 02/28/19   Lennice Sites, DO  lisinopril (PRINIVIL,ZESTRIL) 40 MG tablet Take 20 mg by mouth every morning. Takes 0.5 tablet    [provider]  Multiple Vitamins-Minerals (CENTRUM SILVER 50+MEN) TABS Take 1 tablet by mouth daily.    [provider]  oxybutynin (DITROPAN) 5 MG tablet Take 5 mg by mouth 2 (two) times daily.    [provider]  oxyCODONE-acetaminophen (PERCOCET/ROXICET) 5-325 MG tablet Take 1-2 tablets by mouth every 6 (six) hours as needed (prn pain). Patient not taking: Reported on 10/17/2017 10/01/17   Marybelle Killings, MD  polyethylene glycol powder (GLYCOLAX/MIRALAX) powder Take 17 g by mouth daily.    [provider]    Family History Family History  Problem Relation Age of Onset  . Stroke Mother   . Stroke Father     Social History  Social History   Tobacco Use  . Smoking status: Current Every Day Smoker    Packs/day: 0.50    Years: 51.00    Pack years: 25.50    Types: Cigarettes  . Smokeless tobacco: Never Used  Substance Use Topics  . Alcohol use: No    Frequency: Never    Comment: very  rare   moonshine  . Drug use: No     Allergies   Cetirizine & related   Review of Systems Review of Systems  Constitutional: Negative for chills, fatigue and fever.  HENT: Negative for ear pain and sore throat.   Eyes: Negative for pain and visual disturbance.  Respiratory: Negative for cough and shortness of breath.   Cardiovascular: Negative for chest pain and palpitations.  Gastrointestinal: Negative for abdominal pain and vomiting.  Genitourinary: Negative for dysuria and hematuria.  Musculoskeletal: Positive for arthralgias and stiffness. Negative for back pain and neck pain.  Skin: Negative for color change and rash.  Neurological: Negative for seizures and syncope.  All other systems reviewed and are negative.    Physical Exam Updated Vital Signs BP (!) 139/95   Pulse 92   Temp 97.8 F (36.6 C)   Resp 12   Ht 5\' 8"  (1.727 m)   Wt 90.7 kg   SpO2 97%   BMI 30.41 kg/m   Physical Exam Vitals signs and nursing note reviewed.  Constitutional:      Appearance: He is well-developed.  HENT:     Head: Normocephalic and atraumatic.  Eyes:     Conjunctiva/sclera: Conjunctivae normal.  Neck:     Musculoskeletal: Normal range of motion and neck supple. No muscular tenderness.  Cardiovascular:     Rate and Rhythm: Normal rate and regular rhythm.     Pulses: Normal pulses.     Heart sounds: Normal heart sounds. No murmur.  Pulmonary:     Effort: Pulmonary effort is normal. No respiratory distress.     Breath sounds: Normal breath sounds.  Abdominal:     Palpations: Abdomen is soft.     Tenderness: There is no abdominal tenderness.  Musculoskeletal:        General: Tenderness present.      Comments: Tenderness to within the left shoulder joint capsule, no obvious dislocation, decreased range of motion secondary to pain and stiffness,  Skin:    General: Skin is warm and dry.  Neurological:     General: No focal deficit present.     Mental Status: He is alert and oriented to person, place, and time.     Motor: No weakness.     Comments: 5+ out of 5 strength in left upper extremity, normal sensation      ED Treatments / Results  Labs (all labs ordered are listed, but only abnormal results are displayed) Labs Reviewed - No data to display  EKG EKG Interpretation  Date/Time:  Saturday February 28 2019 17:14:25 EDT Ventricular Rate:  98 PR Interval:    QRS Duration: 72 QT Interval:  339 QTC Calculation: 433 R Axis:   2 Text Interpretation:  Sinus rhythm Confirmed by Lennice Sites [phone]) on 02/28/2019 5:17:57 PM   Radiology Dg Shoulder Left  Result Date: 02/28/2019 CLINICAL DATA:  Pain without injury. EXAM: LEFT SHOULDER - 2+ VIEW COMPARISON:  None. FINDINGS: There is no evidence of fracture or dislocation. There is no evidence of arthropathy or other focal bone abnormality. Soft tissues are unremarkable. IMPRESSION: Negative. Electronically Signed   By: Dorise Bullion III M.D   On: 02/28/2019 18:14    Procedures Procedures (including critical care time)  Medications Ordered in ED Medications  lidocaine (LIDODERM) 5 % 1 patch (1 patch Transdermal Patch Applied 02/28/19 1725)     Initial Impression / Assessment and Plan / ED Course  I have reviewed the triage vital signs and the nursing notes.  Pertinent labs & imaging results that were available during my care of the patient were reviewed by me and considered in my medical decision making (see chart for details).     Randy [name] is a 73 year old male who presents the ED with left shoulder pain.  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  Worse with movement.  Appears to have some symptoms of a frozen  shoulder versus possible rotator cuff injury.  No obvious dislocation on exam.  No neck pain.  Tender within the joint capsule.  Has been using analgesic cream at home with some relief.  Good pulses and strength in the left upper extremity.  Normal neurological exam.  Does not have any chest pain, shortness of breath.  EKG shows sinus rhythm.  No ischemic changes.  Doubt cardiac process.  X-ray of the left shoulder showed no fracture dislocation.  Will prescribe lidocaine patch, Voltaren gel.  Recommend follow-up with primary care doctor.  May benefit from physical therapy.  May need an MRI to further evaluate.  ,This chart was dictated using voice recognition software.  Despite best efforts to proofread,  errors can occur which can change the documentation meaning.   Final Clinical Impressions(s) / ED Diagnoses   Final diagnoses:  Acute pain of left shoulder    ED Discharge Orders         Ordered    lidocaine (LIDODERM) 5 %  Every 24 hours     02/28/19 1838    diclofenac sodium (VOLTAREN) 1 % GEL  2 times daily     02/28/19 Seward Grater, DO 02/28/19 1839

## 2019-02-28 NOTE — ED Triage Notes (Signed)
Pt reports pain in the left shoulder. 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 Pt can not lift his arm without pain. Pt reports no other sx.

## 2019-03-27 ENCOUNTER — Encounter: Payer: Self-pay | Admitting: Orthopaedic Surgery

## 2019-11-12 ENCOUNTER — Encounter: Payer: Self-pay | Admitting: Orthopaedic Surgery

## 2019-12-10 ENCOUNTER — Ambulatory Visit (INDEPENDENT_AMBULATORY_CARE_PROVIDER_SITE_OTHER): Payer: No Typology Code available for payment source | Admitting: Orthopaedic Surgery

## 2019-12-10 ENCOUNTER — Other Ambulatory Visit: Payer: Self-pay

## 2019-12-10 ENCOUNTER — Encounter: Payer: Self-pay | Admitting: Orthopaedic Surgery

## 2019-12-10 VITALS — BP 96/56 | HR 91 | Ht 68.0 in | Wt 172.0 lb

## 2019-12-10 DIAGNOSIS — M5126 Other intervertebral disc displacement, lumbar region: Secondary | ICD-10-CM | POA: Diagnosis not present

## 2019-12-10 NOTE — Progress Notes (Signed)
Office Visit Note   Patient: Randy [name]           Date of Birth: 03/15/1946           MRN: [id number] Visit Date: 12/10/2019              Requested by: Monico Blitz, MD [address] [place],  [place] [address] PCP: Monico Blitz, MD   Assessment & Plan: Visit Diagnoses: No diagnosis found.  Plan: Reviewed the patient the MRI scan.  He has postoperative changes at L2-3 without spinal stenosis.  L4-5 shows a large cephalad migrated free fragment that occupies half the canal.  Patient is having no significant pain is mobile.  As long as he does well no surgery or or epidural is recommended.  I will recheck him again in 4 weeks.  If he suddenly develops numbness or leg weakness or other symptoms of cauda equina which I discussed with them in detail he will contact us.  He likely has large enough free fragment that he will get resorption of the fragment.  Follow-Up Instructions: Return in about 4 weeks (around 01/07/2020).   Orders:  No orders of the defined types were placed in this encounter.  No orders of the defined types were placed in this encounter.     Procedures: No procedures performed   Clinical Data: No additional findings.   Subjective: Chief Complaint  Patient presents with  . Lower Back - Pain    HPI 74 year old male here on [place] referral for evaluation of large extruded fragment cephalad from the L4-5 disc space.  Patient had previous surgery in for a large shows disc fragment at L2-3 with severe spinal stenosis and was in a wheelchair.  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  He was concerned that some of the pain shot from his back and his legs called in MRI scan was obtained.  MRI scan shows large disc herniation with cephalad migration causing moderate stenosis and pressure in the left L5 and L4 nerve root.  Patient states he is occasionally  felt a twinge in his back other than that he has been able to ambulate but not noticed any weakness no numbness or tingling in his feet.  Review of Systems 14 point systems updated unchanged from 2019 February surgery other than as mentioned in HPI.  Objective: Vital Signs: BP (!) 96/56   Pulse 91   Ht 5\' 8"  (1.727 m)   Wt 172 lb (78 kg)   BMI 26.15 kg/m   Physical Exam Constitutional:      Appearance: He is well-developed.  HENT:     Head: Normocephalic and atraumatic.  Eyes:     Pupils: Pupils are equal, round, and reactive to light.  Neck:     Thyroid: No thyromegaly.     Trachea: No tracheal deviation.  Cardiovascular:     Rate and Rhythm: Normal rate.  Pulmonary:     Effort: Pulmonary effort is normal.     Breath sounds: No wheezing.  Abdominal:     General: Bowel sounds are normal.     Palpations: Abdomen is soft.  Skin:    General: Skin is warm and dry.     Capillary Refill: Capillary refill takes less than 2 seconds.  Neurological:     Mental Status: He is alert and  oriented to person, place, and time.  Psychiatric:        Behavior: Behavior normal.        Thought Content: Thought content normal.        Judgment: Judgment normal.     Ortho Exam negative straight leg raising to 90 degrees.  Anterior tib EHL is strong right and left he moves quickly from sitting to standing walks back and forth up and down the halls with no discomfort.  Specialty Comments:  No specialty comments available.  Imaging: No results found.   PMFS History: Patient Active Problem List   Diagnosis Date Noted  . HNP (herniated nucleus pulposus), lumbar 09/30/2017  . Prostate cancer ([place]) 04/11/2016  . Cervical spondylosis with myelopathy 03/04/2014   Past Medical History:  Diagnosis Date  . Anxiety    HAS  PTSD  . BPH (benign prostatic hypertrophy)   . Cancer ([place])    PROSTATE  . Cataracts, bilateral   . CKD (chronic kidney disease), stage III   . Cough    FOR 1 YEAR  WITH THICK WHITE PHLEGM  . Difficult intubation 2015   KEPT OVERNIGHT WITH LAST NECK SURGERY, TOLD TUBE WAS HARDER TO INSERT  . ED (erectile dysfunction)   . GERD (gastroesophageal reflux disease)   . H/O bladder problems   . Hepatitis    "yellow jaundice" -   . History of adenomatous polyp of colon   . History of condyloma acuminatum   . History of syncope    2003-- ? idiology--  per echo ef >55%  . Hypertension   . Insulin dependent type 2 diabetes mellitus ([place])   . Lumbar herniated disc    Pulposus  . Malfunction of penile prosthesis ([place])   . OA (osteoarthritis)    hips  . OSA on CPAP    severe OSA  per study 07-01-2002  (done at [facility])  occasionally uses cpap  . Peripheral neuropathy   . Pneumonia 2015  . PTSD (post-traumatic stress disorder)   . PTSD (post-traumatic stress disorder)   . Sleep apnea   . Spondylolysis of cervical region   . Wears glasses     Family History  Problem Relation Age of Onset  . Stroke Mother   . Stroke Father     Past Surgical History:  Procedure Laterality Date  . ANTERIOR CERVICAL DECOMP/DISCECTOMY FUSION N/A 03/04/2014   Procedure: ANTERIOR CERVICAL DECOMPRESSION/DISCECTOMY FUSION 2 LEVELS;  Surgeon: Ophelia Charter, MD;  Location: [place] [facility];  Service: Neurosurgery;  Laterality: N/A;  C34 C45 anterior cervical decompression with fusion interbody prosthesis plating and bonegraft  . CARPAL TUNNEL RELEASE Right 03/2004  . CERVICAL FORAMENECTOMY  Nov 2005//   2000   right  C7 -- T1/   yr 2000 unknown level  . LUMBAR LAMINECTOMY/DECOMPRESSION MICRODISCECTOMY N/A 09/30/2017   Procedure: L2-3 LAMINECTOMY WITH MICRODISCECTOMY;  Surgeon: Marybelle Killings, MD;  Location: [place];  Service: Orthopedics;  Laterality: N/A;  . PENILE PROSTHESIS IMPLANT N/A 03/04/2015   Procedure: REMOVAL AND REPLACMENT PENILE PROTHESIS INFLATABLE COLOPLAST INFRAPUBIC APPROACH;  Surgeon: Kathie Rhodes, MD;  Location: [place];  Service: Urology;  Laterality:  N/A;  . [place]  . REMOVAL CYST FROM CHEEK  Jan 2016   benign  . ROBOT ASSISTED LAPAROSCOPIC RADICAL PROSTATECTOMY N/A 04/11/2016   Procedure: XI ROBOTIC ASSISTED LAPAROSCOPIC RADICAL PROSTATECTOMY;  Surgeon: Ardis Hughs, MD;  [facility];  Service: Urology;  Laterality: N/A;   Social  History   Occupational History  . Not on file  Tobacco Use  . Smoking status: Current Every Day Smoker    Packs/day: 0.50    Years: 51.00    Pack years: 25.50    Types: Cigarettes  . Smokeless tobacco: Never Used  Substance and Sexual Activity  . Alcohol use: No    Comment: very  rare   moonshine  . Drug use: No  . Sexual activity: Not on file

## 2019-12-21 ENCOUNTER — Telehealth: Payer: Self-pay | Admitting: Orthopaedic Surgery

## 2019-12-21 NOTE — Telephone Encounter (Signed)
12/10/2019 ov note faxed to [facility] [phone]

## 2020-01-07 ENCOUNTER — Ambulatory Visit (INDEPENDENT_AMBULATORY_CARE_PROVIDER_SITE_OTHER): Payer: No Typology Code available for payment source | Admitting: Orthopaedic Surgery

## 2020-01-07 ENCOUNTER — Other Ambulatory Visit: Payer: Self-pay

## 2020-01-07 VITALS — Ht 68.0 in | Wt 170.0 lb

## 2020-01-07 DIAGNOSIS — M5126 Other intervertebral disc displacement, lumbar region: Secondary | ICD-10-CM

## 2020-01-07 NOTE — Progress Notes (Signed)
Office Visit Note   Patient: Randy [name]           Date of Birth: [birth year]           MRN: [id number] Visit Date: 01/07/2020              Requested by: Monico Blitz, MD [address] [place],  [place] [address] PCP: Monico Blitz, MD   Assessment & Plan: Visit Diagnoses:  1. HNP (herniated nucleus pulposus), lumbar     Plan: Patient is doing well we will release him from care is not having any pain has normal activity levels. He may have had some resorption of the large migrated fragment. If he gets recurrence of symptoms he can return otherwise follow-up as needed.  Follow-Up Instructions: Return if symptoms worsen or fail to improve.   Orders:  No orders of the defined types were placed in this encounter.  No orders of the defined types were placed in this encounter.     Procedures: No procedures performed   Clinical Data: No additional findings.   Subjective: Chief Complaint  Patient presents with  . Lower Back - Follow-up    HPI 74 year old male returns with follow-up for large disc herniation at L4-5 with migrated fragment. Had previous L2-3 microdiscectomy by me on 09/30/2017. He has had increased pain and MRI done at the [facility] showed a large cephalad migrated free fragment occupying half the canal. Patient denies any bowel bladder symptoms no weakness. He is not had any numbness or tingling in the last 2 months. With the surgery at L2-3 patient was in a wheelchair and pain was severe. Patient states that when he had the MRI he had some tingling in his leg but no weakness. He has been normal activity just avoiding heavy lifting.  Review of Systems 14 point system update unchanged from February 2019 other than as mentioned above.   Objective: Vital Signs: Ht 5\' 8"  (1.727 m)   Wt 170 lb (77.1 kg)   BMI 25.85 kg/m   Physical Exam Constitutional:      Appearance: He is well-developed.  HENT:     Head: Normocephalic and atraumatic.  Eyes:     Pupils:  Pupils are equal, round, and reactive to light.  Neck:     Thyroid: No thyromegaly.     Trachea: No tracheal deviation.  Cardiovascular:     Rate and Rhythm: Normal rate.  Pulmonary:     Effort: Pulmonary effort is normal.     Breath sounds: No wheezing.  Abdominal:     General: Bowel sounds are normal.     Palpations: Abdomen is soft.  Skin:    General: Skin is warm and dry.     Capillary Refill: Capillary refill takes less than 2 seconds.  Neurological:     Mental Status: He is alert and oriented to person, place, and time.  Psychiatric:        Behavior: Behavior normal.        Thought Content: Thought content normal.        Judgment: Judgment normal.     Ortho Exam patient is get from sitting standing walks up and down the hall he can heel and toe walk has no pain no pain with rotation. Lumbar incisions well-healed. Anterior tib EHL is strong.  Specialty Comments:  No specialty comments available.  Imaging: No results found.   PMFS History: Patient Active Problem List   Diagnosis Date Noted  . HNP (herniated nucleus  pulposus), lumbar 09/30/2017  . Prostate cancer ([place]) 04/11/2016  . Cervical spondylosis with myelopathy 03/04/2014   Past Medical History:  Diagnosis Date  . Anxiety    HAS  PTSD  . BPH (benign prostatic hypertrophy)   . Cancer ([place])    PROSTATE  . Cataracts, bilateral   . CKD (chronic kidney disease), stage III   . Cough    FOR 1 YEAR WITH THICK WHITE PHLEGM  . Difficult intubation 2015   KEPT OVERNIGHT WITH LAST NECK SURGERY, TOLD TUBE WAS HARDER TO INSERT  . ED (erectile dysfunction)   . GERD (gastroesophageal reflux disease)   . H/O bladder problems   . Hepatitis    "yellow jaundice" -   . History of adenomatous polyp of colon   . History of condyloma acuminatum   . History of syncope    2003-- ? idiology--  per echo ef >55%  . Hypertension   . Insulin dependent type 2 diabetes mellitus ([place])   . Lumbar herniated disc    Pulposus   . Malfunction of penile prosthesis ([place])   . OA (osteoarthritis)    hips  . OSA on CPAP    severe OSA  per study 07-01-2002  (done at [facility])  occasionally uses cpap  . Peripheral neuropathy   . Pneumonia 2015  . PTSD (post-traumatic stress disorder)   . PTSD (post-traumatic stress disorder)   . Sleep apnea   . Spondylolysis of cervical region   . Wears glasses     Family History  Problem Relation Age of Onset  . Stroke Mother   . Stroke Father     Past Surgical History:  Procedure Laterality Date  . ANTERIOR CERVICAL DECOMP/DISCECTOMY FUSION N/A 03/04/2014   Procedure: ANTERIOR CERVICAL DECOMPRESSION/DISCECTOMY FUSION 2 LEVELS;  Surgeon: Ophelia Charter, MD;  Location: [place] [facility];  Service: Neurosurgery;  Laterality: N/A;  C34 C45 anterior cervical decompression with fusion interbody prosthesis plating and bonegraft  . CARPAL TUNNEL RELEASE Right 03/2004  . CERVICAL FORAMENECTOMY  Nov 2005//   2000   right  C7 -- T1/   yr 2000 unknown level  . LUMBAR LAMINECTOMY/DECOMPRESSION MICRODISCECTOMY N/A 09/30/2017   Procedure: L2-3 LAMINECTOMY WITH MICRODISCECTOMY;  Surgeon: Marybelle Killings, MD;  Location: [place];  Service: Orthopedics;  Laterality: N/A;  . PENILE PROSTHESIS IMPLANT N/A 03/04/2015   Procedure: REMOVAL AND REPLACMENT PENILE PROTHESIS INFLATABLE COLOPLAST INFRAPUBIC APPROACH;  Surgeon: Kathie Rhodes, MD;  Location: [place];  Service: Urology;  Laterality: N/A;  . [place]  . REMOVAL CYST FROM CHEEK  Jan 2016   benign  . ROBOT ASSISTED LAPAROSCOPIC RADICAL PROSTATECTOMY N/A 04/11/2016   Procedure: XI ROBOTIC ASSISTED LAPAROSCOPIC RADICAL PROSTATECTOMY;  Surgeon: Ardis Hughs, MD;  [facility];  Service: Urology;  Laterality: N/A;   Social History   Occupational History  . Not on file  Tobacco Use  . Smoking status: Current Every Day Smoker    Packs/day: 0.50    Years: 51.00    Pack years: 25.50    Types: Cigarettes  .  Smokeless tobacco: Never Used  Substance and Sexual Activity  . Alcohol use: No    Comment: very  rare   moonshine  . Drug use: No  . Sexual activity: Not on file

## 2020-02-05 ENCOUNTER — Telehealth: Payer: Self-pay | Admitting: Orthopaedic Surgery

## 2020-02-05 NOTE — Telephone Encounter (Signed)
Pt called stating his back is giving him more pain and he would like to know if surgery could be a option; if so what does he need to do to get scheduled?  [phone]

## 2020-02-05 NOTE — Telephone Encounter (Signed)
Please advise 

## 2020-02-08 NOTE — Telephone Encounter (Signed)
He will need ROV . Need to review MRI again , not sure if on disc if we have it or he does thanks

## 2020-02-11 NOTE — Telephone Encounter (Signed)
I have tried to reach patient with no luck. Could you try him? Dr. Lorin Mercy needs to see him back in the office. If you will send me message back, I will be on the look out for his CD. Thanks.

## 2020-06-09 DIAGNOSIS — T1502XD Foreign body in cornea, left eye, subsequent encounter: Secondary | ICD-10-CM | POA: Diagnosis not present

## 2020-09-16 ENCOUNTER — Other Ambulatory Visit: Payer: No Typology Code available for payment source

## 2020-09-16 DIAGNOSIS — Z20822 Contact with and (suspected) exposure to covid-19: Secondary | ICD-10-CM

## 2020-09-20 LAB — NOVEL CORONAVIRUS, NAA: SARS-CoV-2, NAA: NOT DETECTED

## 2020-11-10 DIAGNOSIS — R040 Epistaxis: Secondary | ICD-10-CM | POA: Diagnosis not present

## 2020-11-12 DIAGNOSIS — F17200 Nicotine dependence, unspecified, uncomplicated: Secondary | ICD-10-CM | POA: Diagnosis not present

## 2020-11-12 DIAGNOSIS — E119 Type 2 diabetes mellitus without complications: Secondary | ICD-10-CM | POA: Diagnosis not present

## 2020-11-12 DIAGNOSIS — I2510 Atherosclerotic heart disease of native coronary artery without angina pectoris: Secondary | ICD-10-CM | POA: Diagnosis not present

## 2020-11-12 DIAGNOSIS — R040 Epistaxis: Secondary | ICD-10-CM | POA: Diagnosis not present

## 2020-11-12 DIAGNOSIS — I10 Essential (primary) hypertension: Secondary | ICD-10-CM | POA: Diagnosis not present

## 2020-11-13 ENCOUNTER — Encounter (HOSPITAL_COMMUNITY): Payer: Self-pay

## 2020-11-13 ENCOUNTER — Other Ambulatory Visit: Payer: Self-pay

## 2020-11-13 ENCOUNTER — Emergency Department (HOSPITAL_COMMUNITY)
Admission: EM | Admit: 2020-11-13 | Discharge: 2020-11-13 | Disposition: A | Discharge status: Home / Self Care | Payer: No Typology Code available for payment source | Attending: Emergency Medicine | Admitting: Emergency Medicine

## 2020-11-13 DIAGNOSIS — I129 Hypertensive chronic kidney disease with stage 1 through stage 4 chronic kidney disease, or unspecified chronic kidney disease: Secondary | ICD-10-CM | POA: Insufficient documentation

## 2020-11-13 DIAGNOSIS — Z794 Long term (current) use of insulin: Secondary | ICD-10-CM | POA: Diagnosis not present

## 2020-11-13 DIAGNOSIS — N183 Chronic kidney disease, stage 3 unspecified: Secondary | ICD-10-CM | POA: Diagnosis not present

## 2020-11-13 DIAGNOSIS — Z8546 Personal history of malignant neoplasm of prostate: Secondary | ICD-10-CM | POA: Diagnosis not present

## 2020-11-13 DIAGNOSIS — R040 Epistaxis: Secondary | ICD-10-CM | POA: Diagnosis present

## 2020-11-13 DIAGNOSIS — E1122 Type 2 diabetes mellitus with diabetic chronic kidney disease: Secondary | ICD-10-CM | POA: Insufficient documentation

## 2020-11-13 DIAGNOSIS — Z79899 Other long term (current) drug therapy: Secondary | ICD-10-CM | POA: Insufficient documentation

## 2020-11-13 DIAGNOSIS — F17210 Nicotine dependence, cigarettes, uncomplicated: Secondary | ICD-10-CM | POA: Diagnosis not present

## 2020-11-13 NOTE — ED Notes (Signed)
Provider adjusted rhino rocket in left nare.

## 2020-11-13 NOTE — ED Triage Notes (Signed)
Patient reports he started with a nose bleed yesterday, was seen at OSH and had a rhinorocket placed, is followed by ENT at [facility] for continued nose bleed, reports about 2 tonight he began to bleed again.

## 2020-11-14 DIAGNOSIS — I2510 Atherosclerotic heart disease of native coronary artery without angina pectoris: Secondary | ICD-10-CM | POA: Diagnosis not present

## 2020-11-14 DIAGNOSIS — R040 Epistaxis: Secondary | ICD-10-CM | POA: Diagnosis not present

## 2020-11-14 DIAGNOSIS — E119 Type 2 diabetes mellitus without complications: Secondary | ICD-10-CM | POA: Diagnosis not present

## 2020-11-14 DIAGNOSIS — I10 Essential (primary) hypertension: Secondary | ICD-10-CM | POA: Diagnosis not present

## 2020-11-14 DIAGNOSIS — F17200 Nicotine dependence, unspecified, uncomplicated: Secondary | ICD-10-CM | POA: Diagnosis not present

## 2020-11-14 NOTE — ED Provider Notes (Signed)
[place] EMERGENCY DEPARTMENT Provider Note   CSN: [id number] Arrival date & time: 11/13/20  7124     History Chief Complaint  Patient presents with  . Epistaxis    Randy [name] is a 75 y.o. male.  History of nosebleeds with multiple multiple cauterizations.  Seen at [facility] yesterday had a Rhino Rocket placed.  Went home couple hours later had some oozing again so presents here for further evaluation.  Patient without any blood in the back of his throat.  It stopped by the time I have seen him.  No blood thinners.  States he does get some sinus pressure just before this starts but is adamant this is not because of high blood pressure.  No other associated symptoms. Has an appt with ENT again in a few days.    Epistaxis      Past Medical History:  Diagnosis Date  . Anxiety    HAS  PTSD  . BPH (benign prostatic hypertrophy)   . Cancer ([place])    PROSTATE  . Cataracts, bilateral   . CKD (chronic kidney disease), stage III ([place])   . Cough    FOR 1 YEAR WITH THICK WHITE PHLEGM  . Difficult intubation 2015   KEPT OVERNIGHT WITH LAST NECK SURGERY, TOLD TUBE WAS HARDER TO INSERT  . ED (erectile dysfunction)   . GERD (gastroesophageal reflux disease)   . H/O bladder problems   . Hepatitis    "yellow jaundice" -   . History of adenomatous polyp of colon   . History of condyloma acuminatum   . History of syncope    2003-- ? idiology--  per echo ef >55%  . Hypertension   . Insulin dependent type 2 diabetes mellitus ([place])   . Lumbar herniated disc    Pulposus  . Malfunction of penile prosthesis ([place])   . OA (osteoarthritis)    hips  . OSA on CPAP    severe OSA  per study 07-01-2002  (done at [facility])  occasionally uses cpap  . Peripheral neuropathy   . Pneumonia 2015  . PTSD (post-traumatic stress disorder)   . PTSD (post-traumatic stress disorder)   . Sleep apnea   . Spondylolysis of cervical region   . Wears glasses     Patient Active  Problem List   Diagnosis Date Noted  . HNP (herniated nucleus pulposus), lumbar 09/30/2017  . Prostate cancer ([place]) 04/11/2016  . Cervical spondylosis with myelopathy 03/04/2014    Past Surgical History:  Procedure Laterality Date  . ANTERIOR CERVICAL DECOMP/DISCECTOMY FUSION N/A 03/04/2014   Procedure: ANTERIOR CERVICAL DECOMPRESSION/DISCECTOMY FUSION 2 LEVELS;  Surgeon: Ophelia Charter, MD;  Location: [place] [facility];  Service: Neurosurgery;  Laterality: N/A;  C34 C45 anterior cervical decompression with fusion interbody prosthesis plating and bonegraft  . CARPAL TUNNEL RELEASE Right 03/2004  . CERVICAL FORAMENECTOMY  Nov 2005//   2000   right  C7 -- T1/   yr 2000 unknown level  . LUMBAR LAMINECTOMY/DECOMPRESSION MICRODISCECTOMY N/A 09/30/2017   Procedure: L2-3 LAMINECTOMY WITH MICRODISCECTOMY;  Surgeon: Marybelle Killings, MD;  Location: [place];  Service: Orthopedics;  Laterality: N/A;  . PENILE PROSTHESIS IMPLANT N/A 03/04/2015   Procedure: REMOVAL AND REPLACMENT PENILE PROTHESIS INFLATABLE COLOPLAST INFRAPUBIC APPROACH;  Surgeon: Kathie Rhodes, MD;  Location: [place];  Service: Urology;  Laterality: N/A;  . [place]  . REMOVAL CYST FROM CHEEK  Jan 2016   benign  . ROBOT ASSISTED LAPAROSCOPIC  RADICAL PROSTATECTOMY N/A 04/11/2016   Procedure: XI ROBOTIC ASSISTED LAPAROSCOPIC RADICAL PROSTATECTOMY;  Surgeon: Ardis Hughs, MD;  [facility];  Service: Urology;  Laterality: N/A;       Family History  Problem Relation Age of Onset  . Stroke Mother   . Stroke Father     Social History   Tobacco Use  . Smoking status: Current Every Day Smoker    Packs/day: 0.50    Years: 51.00    Pack years: 25.50    Types: Cigarettes  . Smokeless tobacco: Never Used  Vaping Use  . Vaping Use: Never used  Substance Use Topics  . Alcohol use: No    Comment: very  rare   moonshine  . Drug use: No    Home Medications Prior to Admission medications    Medication Sig Start Date End Date Taking? Authorizing Provider  allopurinol (ZYLOPRIM) 100 MG tablet Take 100 mg by mouth at bedtime.   Yes [provider]  atorvastatin (LIPITOR) 40 MG tablet Take 40 mg by mouth at bedtime.    Yes [provider]  carboxymethylcellulose 1 % ophthalmic solution 1 drop 4 (four) times daily as needed (dry eyes).    Yes [provider]  cyclobenzaprine (FLEXERIL) 10 MG tablet Take 10 mg by mouth daily as needed for muscle spasms.    Yes [provider]  cycloSPORINE (RESTASIS) 0.05 % ophthalmic emulsion Place 1 drop into both eyes 2 (two) times daily as needed (dry eyes).    Yes [provider]  dextrose (GLUTOSE) 40 % GEL Take 1 Tube by mouth as needed for low blood sugar.   Yes [provider]  furosemide (LASIX) 20 MG tablet Take 20 mg by mouth daily.   Yes [provider]  insulin NPH-regular Human (NOVOLIN 70/30) (70-30) 100 UNIT/ML injection Inject 46-48 Units into the skin 2 (two) times daily. 48 units in the morning and 46 units in the evening.   Yes [provider]  lisinopril (PRINIVIL,ZESTRIL) 40 MG tablet Take 20 mg by mouth every morning. Takes 0.5 tablet   Yes [provider]  Multiple Vitamins-Minerals (CENTRUM SILVER 50+MEN) TABS Take 1 tablet by mouth daily.   Yes [provider]  mupirocin ointment (BACTROBAN) 2 % Place 1 application into the nose 3 (three) times daily. 11/10/20 11/17/20 Yes [provider]  oxybutynin (DITROPAN) 5 MG tablet Take 5 mg by mouth 2 (two) times daily.   Yes [provider]  oxymetazoline (AFRIN NASAL SPRAY) 0.05 % nasal spray Place 3 sprays into both nostrils in the morning and at bedtime. 11/12/20  Yes [provider]  polyethylene glycol powder (GLYCOLAX/MIRALAX) powder Take 17 g by mouth daily as needed for mild constipation.   Yes [provider]    Allergies    Cetirizine & related  Review  of Systems   Review of Systems  HENT: Positive for nosebleeds.   All other systems reviewed and are negative.   Physical Exam Updated Vital Signs BP 131/87   Pulse 68   Temp 97.8 F (36.6 C) (Oral)   Resp (!) 6   Ht 5\' 8"  (1.727 m)   Wt 79.4 kg   SpO2 98%   BMI 26.61 kg/m   Physical Exam Vitals and nursing note reviewed.  Constitutional:      Appearance: He is well-developed.  HENT:     Head: Normocephalic and atraumatic.     Comments: Rhino rocket in left nare with some  blood clot adhered to it    Mouth/Throat:     Mouth: Mucous membranes are moist.     Pharynx: Oropharynx is clear.  Eyes:     Pupils: Pupils are equal, round, and reactive to light.  Cardiovascular:     Rate and Rhythm: Normal rate.  Pulmonary:     Effort: Pulmonary effort is normal. No respiratory distress.  Abdominal:     General: Abdomen is flat. There is no distension.  Musculoskeletal:        General: Normal range of motion.     Cervical back: Normal range of motion.  Skin:    General: Skin is warm and dry.  Neurological:     General: No focal deficit present.     Mental Status: He is alert.     ED Results / Procedures / Treatments   Labs (all labs ordered are listed, but only abnormal results are displayed) Labs Reviewed - No data to display  EKG None  Radiology No results found.  Procedures Procedures   Medications Ordered in ED Medications - No data to display  ED Course  I have reviewed the triage vital signs and the nursing notes.  Pertinent labs & imaging results that were available during my care of the patient were reviewed by me and considered in my medical decision making (see chart for details).    MDM Rules/Calculators/A&P                          Patient observed about an hour after being here he did start some oozing again so I added another 2 mL of sterile water to his nasal balloon.  He was watched for another couple hours and no recurrent bleeding.  I  discharged.  Discussed that he could add a little bit more water about 1 mL at a time if it started bleeding again.  He stated understanding.  Will follow with ENT.  Will return here if he cannot control his bleeding at home.  Final Clinical Impression(s) / ED Diagnoses Final diagnoses:  Epistaxis    Rx / DC Orders ED Discharge Orders    None       Mesner, Corene Cornea, MD 11/14/20 0025

## 2020-11-18 DIAGNOSIS — R040 Epistaxis: Secondary | ICD-10-CM | POA: Diagnosis not present

## 2020-11-22 DIAGNOSIS — F17210 Nicotine dependence, cigarettes, uncomplicated: Secondary | ICD-10-CM | POA: Diagnosis not present

## 2020-11-22 DIAGNOSIS — R040 Epistaxis: Secondary | ICD-10-CM | POA: Diagnosis not present

## 2021-03-19 DIAGNOSIS — Z20822 Contact with and (suspected) exposure to covid-19: Secondary | ICD-10-CM | POA: Diagnosis not present

## 2021-03-23 ENCOUNTER — Ambulatory Visit (INDEPENDENT_AMBULATORY_CARE_PROVIDER_SITE_OTHER): Payer: No Typology Code available for payment source

## 2021-03-23 ENCOUNTER — Encounter: Payer: Self-pay | Admitting: Orthopaedic Surgery

## 2021-03-23 ENCOUNTER — Other Ambulatory Visit: Payer: Self-pay

## 2021-03-23 ENCOUNTER — Ambulatory Visit (INDEPENDENT_AMBULATORY_CARE_PROVIDER_SITE_OTHER): Payer: No Typology Code available for payment source | Admitting: Orthopaedic Surgery

## 2021-03-23 VITALS — Ht 68.0 in | Wt 165.0 lb

## 2021-03-23 DIAGNOSIS — M25551 Pain in right hip: Secondary | ICD-10-CM

## 2021-03-23 DIAGNOSIS — M545 Low back pain, unspecified: Secondary | ICD-10-CM

## 2021-03-23 IMAGING — CR LEFT SHOULDER - 2+ VIEW
2 series · 2 of 2 positions shown · non-contrast
Comparison: None.

CLINICAL DATA: Pain without injury.

EXAM:
LEFT SHOULDER - 2+ VIEW

[shoulder grashey]
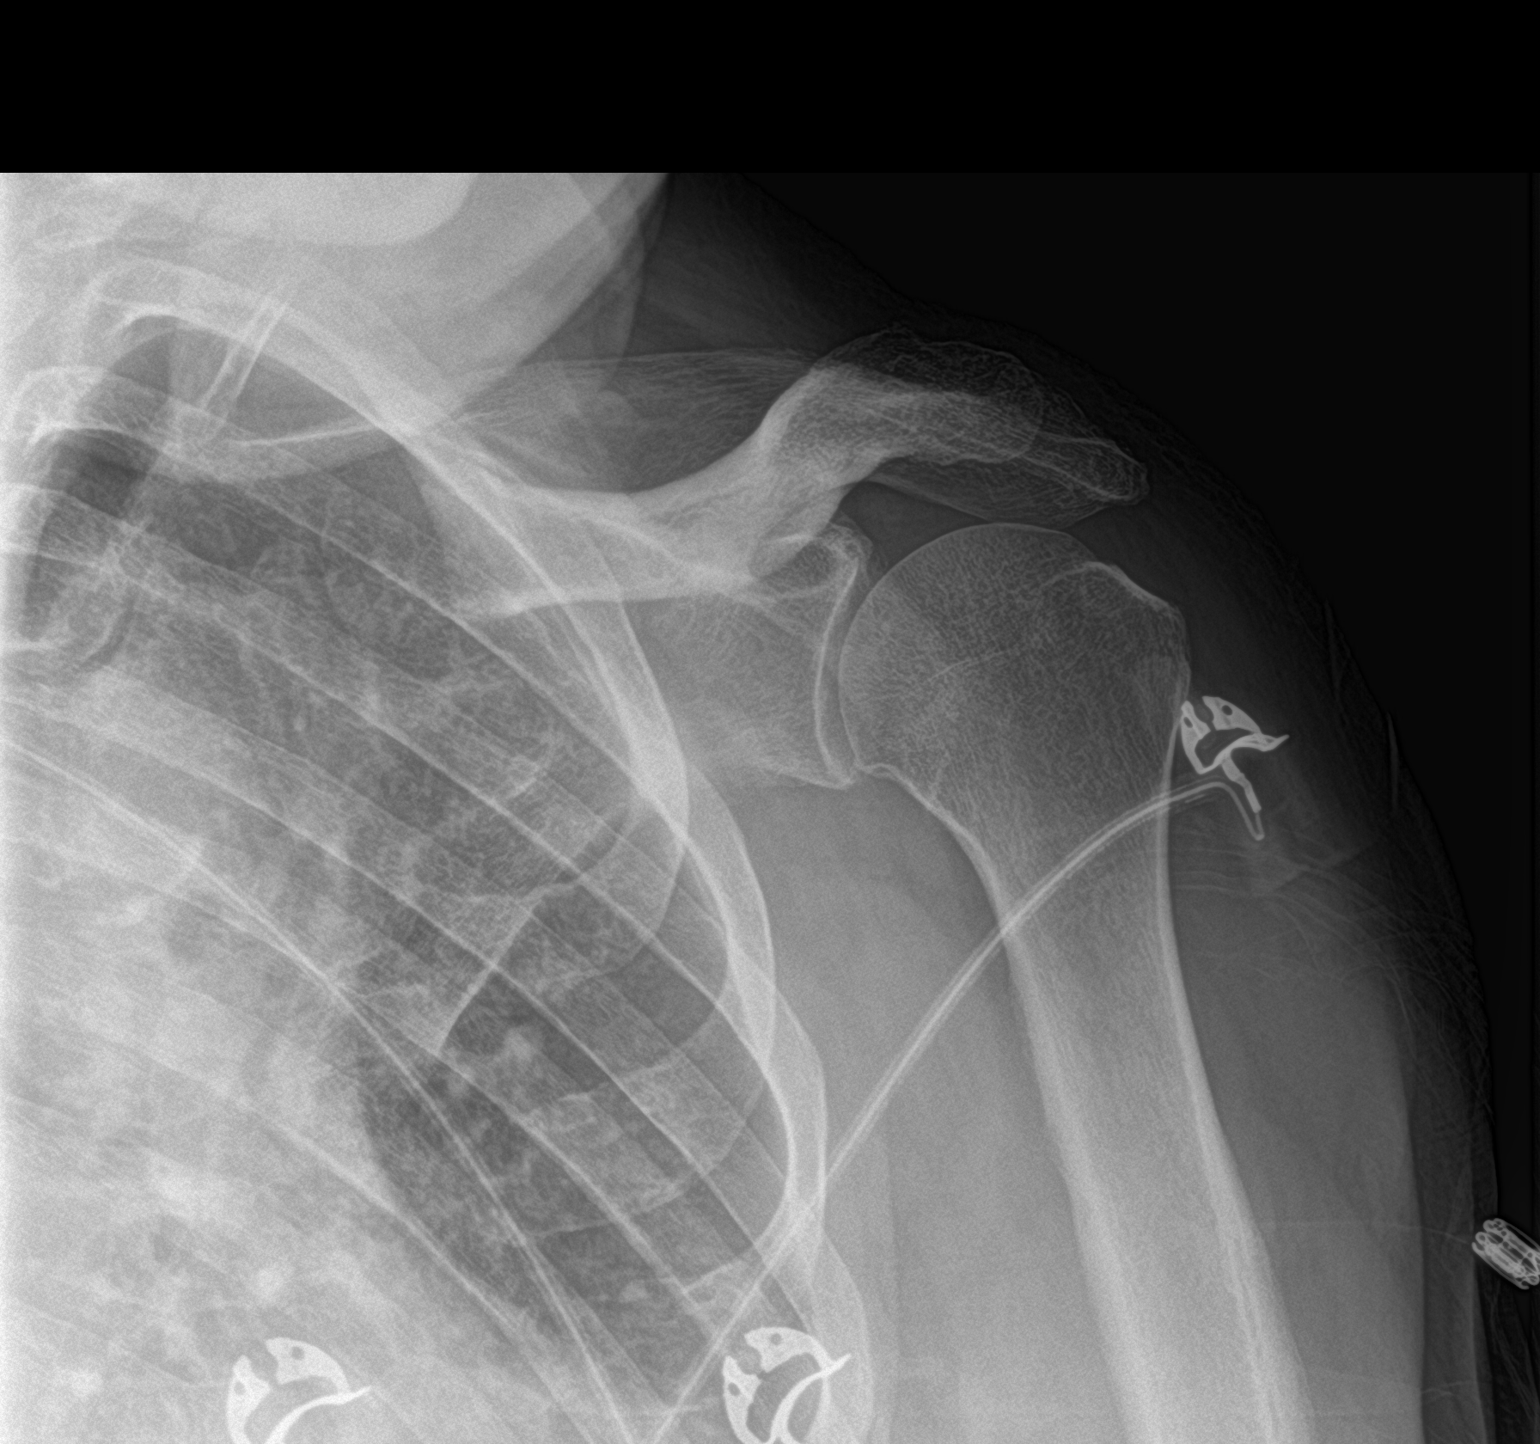

[shoulder y view]
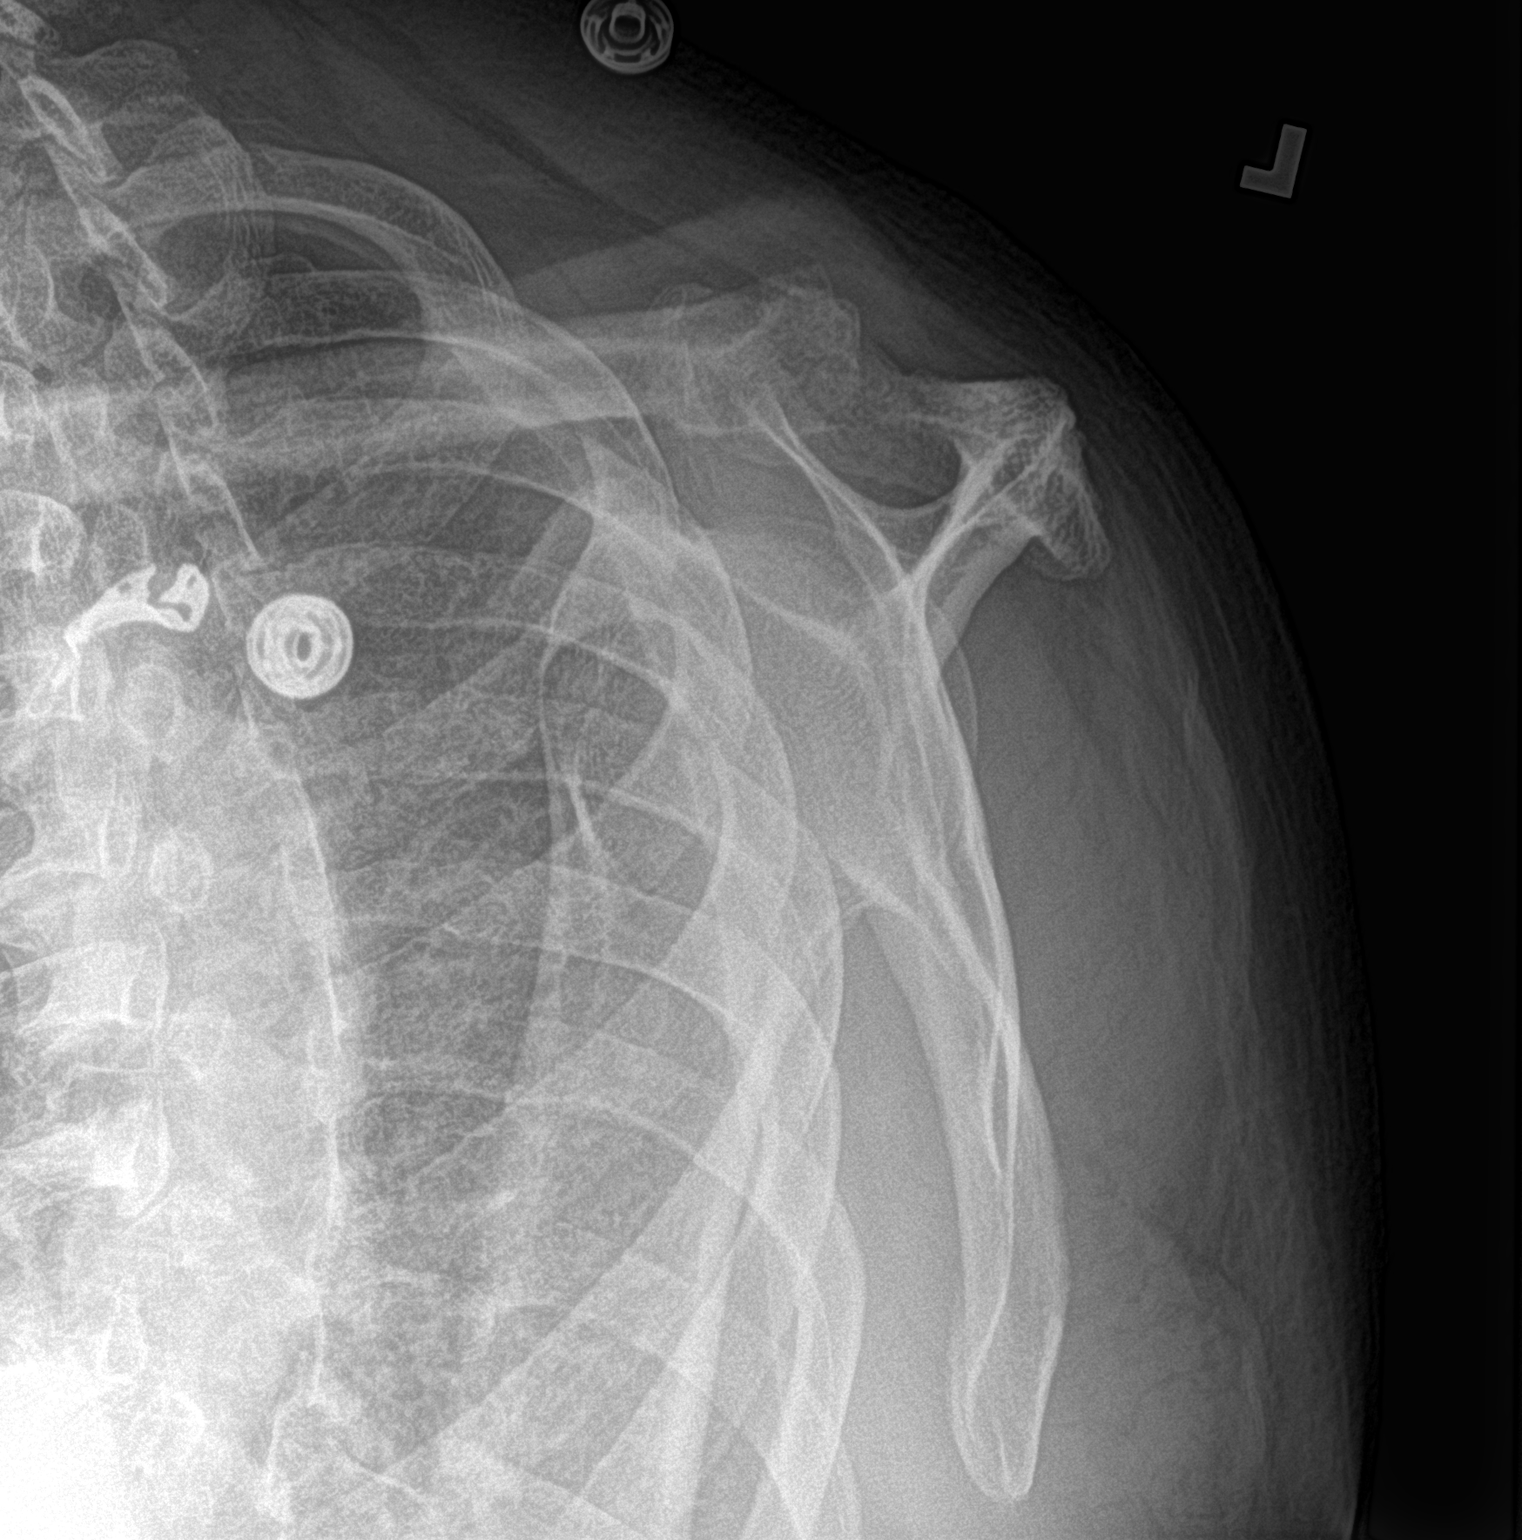

[2 of 2 positions shown; findings below may reference images not displayed]

FINDINGS: There is no evidence of fracture or dislocation. There is no
evidence of arthropathy or other focal bone abnormality. Soft
tissues are unremarkable.
IMPRESSION: Negative.

## 2021-03-23 NOTE — Progress Notes (Signed)
Office Visit Note   Patient: Randy [name]           Date of Birth: May 16, 1946           MRN: [id number] Visit Date: 03/23/2021              Requested by: Monico Blitz, MD [address] [place],  [place] [address] PCP: Monico Blitz, MD   Assessment & Plan: Visit Diagnoses:  1. Acute bilateral low back pain, unspecified whether sciatica present   2. Pain in right hip     Plan: Patient has some mild right hip osteoarthritis mild groin pain but still has good range of motion of his hip no problems with dressing putting on off issues or ambulation.  No Trendelenburg gait.  He can return when he has increased symptoms.  X-ray results were reviewed again with copy of the films.  Follow-Up Instructions: No follow-ups on file.   Orders:  Orders Placed This Encounter  Procedures   XR Lumbar Spine 2-3 Views   XR HIP UNILAT W OR W/O PELVIS 2-3 VIEWS RIGHT   No orders of the defined types were placed in this encounter.     Procedures: No procedures performed   Clinical Data: No additional findings.   Subjective: Chief Complaint  Patient presents with   Lower Back - Pain    HPI 75 year old male seen with some discomfort in his back off and on for a month.  Sometimes it radiates into his right groin.  He has had previous surgery by me with the L2-3 laminectomy 2019.  Two-level anterior procedure by Arnoldo Morale.  Previous posterior neck operation at the [facility] with postop right claw hand.  Patient states she does better sleeping in the recliner.  No associated bowel bladder symptoms.  He has had recurrent problems with nosebleeds multiple visits to the ED.  History of prostate cancer.  Patient not on any blood thinners.  Review of Systems 14 point system update unchanged.   Objective: Vital Signs: Ht '5\' 8"'$  (1.727 m)   Wt 165 lb (74.8 kg)   BMI 25.09 kg/m   Physical Exam Constitutional:      Appearance: He is well-developed.  HENT:     Head: Normocephalic and atraumatic.      Right Ear: External ear normal.     Left Ear: External ear normal.  Eyes:     Pupils: Pupils are equal, round, and reactive to light.  Neck:     Thyroid: No thyromegaly.     Trachea: No tracheal deviation.  Cardiovascular:     Rate and Rhythm: Normal rate.  Pulmonary:     Effort: Pulmonary effort is normal.     Breath sounds: No wheezing.  Abdominal:     General: Bowel sounds are normal.     Palpations: Abdomen is soft.  Musculoskeletal:     Cervical back: Neck supple.  Skin:    General: Skin is warm and dry.     Capillary Refill: Capillary refill takes less than 2 seconds.  Neurological:     Mental Status: He is alert and oriented to person, place, and time.  Psychiatric:        Behavior: Behavior normal.        Thought Content: Thought content normal.        Judgment: Judgment normal.    Ortho Exam minimal lower right groin discomfort with internal rotation but internally rotates 35 degrees right hip negative pain with external rotation no hip flexion  contracture.  He is amatory with negative Trendelenburg gait no trochanteric bursal tenderness.  Clawhand deformity on the right normal on the left interosseous atrophy on the right.  Specialty Comments:  No specialty comments available.  Imaging: No results found.   PMFS History: Patient Active Problem List   Diagnosis Date Noted   HNP (herniated nucleus pulposus), lumbar 09/30/2017   Prostate cancer ([place]) 04/11/2016   Cervical spondylosis with myelopathy 03/04/2014   Past Medical History:  Diagnosis Date   Anxiety    HAS  PTSD   BPH (benign prostatic hypertrophy)    Cancer (HCC)    PROSTATE   Cataracts, bilateral    CKD (chronic kidney disease), stage III (HCC)    Cough    FOR 1 YEAR WITH THICK WHITE PHLEGM   Difficult intubation 2015   KEPT OVERNIGHT WITH LAST NECK SURGERY, TOLD TUBE WAS HARDER TO INSERT   ED (erectile dysfunction)    GERD (gastroesophageal reflux disease)    H/O bladder problems     Hepatitis    "yellow jaundice" -    History of adenomatous polyp of colon    History of condyloma acuminatum    History of syncope    2003-- ? idiology--  per echo ef >55%   Hypertension    Insulin dependent type 2 diabetes mellitus (HCC)    Lumbar herniated disc    Pulposus   Malfunction of penile prosthesis (HCC)    OA (osteoarthritis)    hips   OSA on CPAP    severe OSA  per study 07-01-2002  (done at [facility])  occasionally uses cpap   Peripheral neuropathy    Pneumonia 2015   PTSD (post-traumatic stress disorder)    PTSD (post-traumatic stress disorder)    Sleep apnea    Spondylolysis of cervical region    Wears glasses     Family History  Problem Relation Age of Onset   Stroke Mother    Stroke Father     Past Surgical History:  Procedure Laterality Date   ANTERIOR CERVICAL DECOMP/DISCECTOMY FUSION N/A 03/04/2014   Procedure: ANTERIOR CERVICAL DECOMPRESSION/DISCECTOMY FUSION 2 LEVELS;  Surgeon: Ophelia Charter, MD;  Location: [place] [facility];  Service: Neurosurgery;  Laterality: N/A;  C34 C45 anterior cervical decompression with fusion interbody prosthesis plating and bonegraft   CARPAL TUNNEL RELEASE Right 03/2004   CERVICAL FORAMENECTOMY  Nov 2005//   2000   right  C7 -- T1/   yr 2000 unknown level   LUMBAR LAMINECTOMY/DECOMPRESSION MICRODISCECTOMY N/A 09/30/2017   Procedure: L2-3 LAMINECTOMY WITH MICRODISCECTOMY;  Surgeon: Marybelle Killings, MD;  Location: [place];  Service: Orthopedics;  Laterality: N/A;   PENILE PROSTHESIS IMPLANT N/A 03/04/2015   Procedure: REMOVAL AND REPLACMENT PENILE PROTHESIS INFLATABLE COLOPLAST INFRAPUBIC APPROACH;  Surgeon: Kathie Rhodes, MD;  Location: [place];  Service: Urology;  Laterality: N/A;   PENILE PROSTHESIS PLACEMENT  1998   REMOVAL CYST FROM CHEEK  Jan 2016   benign   ROBOT ASSISTED LAPAROSCOPIC RADICAL PROSTATECTOMY N/A 04/11/2016   Procedure: XI ROBOTIC ASSISTED LAPAROSCOPIC RADICAL PROSTATECTOMY;  Surgeon: Ardis Hughs,  MD;  Location: [facility];  Service: Urology;  Laterality: N/A;   Social History   Occupational History   Not on file  Tobacco Use   Smoking status: Every Day    Packs/day: 0.50    Years: 51.00    Pack years: 25.50    Types: Cigarettes   Smokeless tobacco: Never  Vaping Use   Vaping Use:  Never used  Substance and Sexual Activity   Alcohol use: No    Comment: very  rare   moonshine   Drug use: No   Sexual activity: Not on file

## 2021-03-30 ENCOUNTER — Encounter (HOSPITAL_BASED_OUTPATIENT_CLINIC_OR_DEPARTMENT_OTHER): Payer: Self-pay | Admitting: Otolaryngology

## 2021-03-30 ENCOUNTER — Other Ambulatory Visit: Payer: Self-pay | Admitting: Otolaryngology

## 2021-03-30 ENCOUNTER — Other Ambulatory Visit: Payer: Self-pay

## 2021-03-31 ENCOUNTER — Encounter (HOSPITAL_BASED_OUTPATIENT_CLINIC_OR_DEPARTMENT_OTHER)
Admission: RE | Admit: 2021-03-31 | Discharge: 2021-03-31 | Disposition: A | Discharge status: Home / Self Care | Payer: No Typology Code available for payment source | Source: Ambulatory Visit | Attending: Otolaryngology | Admitting: Otolaryngology

## 2021-03-31 DIAGNOSIS — Z01818 Encounter for other preprocedural examination: Secondary | ICD-10-CM | POA: Insufficient documentation

## 2021-03-31 LAB — BASIC METABOLIC PANEL
Anion gap: 9 (ref 5–15)
BUN: 19 mg/dL (ref 8–23)
CO2: 25 mmol/L (ref 22–32)
Calcium: 9.7 mg/dL (ref 8.9–10.3)
Chloride: 103 mmol/L (ref 98–111)
Creatinine, Ser: 1.65 mg/dL — ABNORMAL HIGH (ref 0.61–1.24)
GFR, Estimated: 43 mL/min — ABNORMAL LOW (ref >60)
Glucose, Bld: 104 mg/dL — ABNORMAL HIGH (ref 70–99)
Potassium: 4.4 mmol/L (ref 3.5–5.1)
Sodium: 137 mmol/L (ref 135–145)

## 2021-04-03 NOTE — Progress Notes (Signed)
Anesthesia consult per Dr.Germeroth, will proceed with surgery as scheduled.

## 2021-04-05 ENCOUNTER — Ambulatory Visit (HOSPITAL_BASED_OUTPATIENT_CLINIC_OR_DEPARTMENT_OTHER): Payer: Medicare Other | Admitting: Certified Registered"

## 2021-04-05 ENCOUNTER — Encounter (HOSPITAL_BASED_OUTPATIENT_CLINIC_OR_DEPARTMENT_OTHER)
Admission: RE | Disposition: A | Discharge status: Home / Self Care | Payer: Self-pay | Source: Home / Self Care | Attending: Otolaryngology

## 2021-04-05 ENCOUNTER — Other Ambulatory Visit: Payer: Self-pay

## 2021-04-05 ENCOUNTER — Ambulatory Visit (HOSPITAL_COMMUNITY)
Admission: RE | Admit: 2021-04-05 | Discharge: 2021-04-05 | Disposition: A | Discharge status: Home / Self Care | Payer: Medicare Other | Attending: Otolaryngology | Admitting: Otolaryngology

## 2021-04-05 ENCOUNTER — Encounter (HOSPITAL_BASED_OUTPATIENT_CLINIC_OR_DEPARTMENT_OTHER): Payer: Self-pay | Admitting: Otolaryngology

## 2021-04-05 DIAGNOSIS — I129 Hypertensive chronic kidney disease with stage 1 through stage 4 chronic kidney disease, or unspecified chronic kidney disease: Secondary | ICD-10-CM | POA: Insufficient documentation

## 2021-04-05 DIAGNOSIS — R040 Epistaxis: Secondary | ICD-10-CM | POA: Diagnosis not present

## 2021-04-05 DIAGNOSIS — Z79899 Other long term (current) drug therapy: Secondary | ICD-10-CM | POA: Insufficient documentation

## 2021-04-05 DIAGNOSIS — K219 Gastro-esophageal reflux disease without esophagitis: Secondary | ICD-10-CM | POA: Diagnosis not present

## 2021-04-05 DIAGNOSIS — F17210 Nicotine dependence, cigarettes, uncomplicated: Secondary | ICD-10-CM | POA: Diagnosis not present

## 2021-04-05 DIAGNOSIS — J3489 Other specified disorders of nose and nasal sinuses: Secondary | ICD-10-CM | POA: Diagnosis present

## 2021-04-05 DIAGNOSIS — Z794 Long term (current) use of insulin: Secondary | ICD-10-CM | POA: Diagnosis not present

## 2021-04-05 DIAGNOSIS — Z888 Allergy status to other drugs, medicaments and biological substances status: Secondary | ICD-10-CM | POA: Diagnosis not present

## 2021-04-05 DIAGNOSIS — N183 Chronic kidney disease, stage 3 unspecified: Secondary | ICD-10-CM | POA: Insufficient documentation

## 2021-04-05 DIAGNOSIS — J340 Abscess, furuncle and carbuncle of nose: Secondary | ICD-10-CM | POA: Diagnosis not present

## 2021-04-05 DIAGNOSIS — E1122 Type 2 diabetes mellitus with diabetic chronic kidney disease: Secondary | ICD-10-CM | POA: Diagnosis not present

## 2021-04-05 HISTORY — PX: NASAL ENDOSCOPY: SHX6577

## 2021-04-05 HISTORY — PX: NASAL HEMORRHAGE CONTROL: SHX287

## 2021-04-05 LAB — GLUCOSE, CAPILLARY
Glucose-Capillary: 109 mg/dL — ABNORMAL HIGH (ref 70–99)
Glucose-Capillary: 111 mg/dL — ABNORMAL HIGH (ref 70–99)

## 2021-04-05 SURGERY — CONTROL OF EPISTAXIS
Anesthesia: General | Site: Nose | Laterality: Left

## 2021-04-05 MED ORDER — ROCURONIUM BROMIDE 100 MG/10ML IV SOLN
INTRAVENOUS | Status: DC | PRN
  Administered 2021-04-05: 30 mg via INTRAVENOUS

## 2021-04-05 MED ORDER — LIDOCAINE HCL (PF) 2 % IJ SOLN
INTRAMUSCULAR | Status: DC | PRN
  Administered 2021-04-05: 100 mg via INTRADERMAL

## 2021-04-05 MED ORDER — HYDROCODONE-ACETAMINOPHEN 5-325 MG PO TABS: 1.0000 | ORAL_TABLET | Freq: Three times a day (TID) | ORAL | 0 refills | Status: AC | PRN

## 2021-04-05 MED ORDER — PROMETHAZINE HCL 25 MG/ML IJ SOLN: 12.5000 mg | Freq: Once | INTRAMUSCULAR | Status: DC | PRN

## 2021-04-05 MED ORDER — DEXAMETHASONE SODIUM PHOSPHATE 4 MG/ML IJ SOLN
INTRAMUSCULAR | Status: DC | PRN
  Administered 2021-04-05: 10 mg via INTRAVENOUS

## 2021-04-05 MED ORDER — LACTATED RINGERS IV SOLN: INTRAVENOUS | Status: DC

## 2021-04-05 MED ORDER — BACITRACIN ZINC 500 UNIT/GM EX OINT
TOPICAL_OINTMENT | CUTANEOUS | Status: DC | PRN
  Administered 2021-04-05: 1 via TOPICAL

## 2021-04-05 MED ORDER — SUCCINYLCHOLINE CHLORIDE 200 MG/10ML IV SOSY
PREFILLED_SYRINGE | INTRAVENOUS | Status: DC | PRN
  Administered 2021-04-05: 140 mg via INTRAVENOUS

## 2021-04-05 MED ORDER — FENTANYL CITRATE (PF) 100 MCG/2ML IJ SOLN
INTRAMUSCULAR | Status: AC
  Filled 2021-04-05: qty 2

## 2021-04-05 MED ORDER — PROPOFOL 10 MG/ML IV BOLUS
INTRAVENOUS | Status: AC
  Filled 2021-04-05: qty 20

## 2021-04-05 MED ORDER — OXYCODONE HCL 5 MG PO TABS
5.0000 mg | ORAL_TABLET | Freq: Once | ORAL | Status: DC | PRN
Start: 2021-04-05 — End: 2021-04-05

## 2021-04-05 MED ORDER — LIDOCAINE-EPINEPHRINE 1 %-1:100000 IJ SOLN
INTRAMUSCULAR | Status: DC | PRN
  Administered 2021-04-05: 3.5 mL

## 2021-04-05 MED ORDER — FENTANYL CITRATE (PF) 100 MCG/2ML IJ SOLN
INTRAMUSCULAR | Status: DC | PRN
  Administered 2021-04-05 (×2): 50 ug via INTRAVENOUS

## 2021-04-05 MED ORDER — OXYMETAZOLINE HCL 0.05 % NA SOLN
NASAL | Status: DC | PRN
  Administered 2021-04-05: 1 via TOPICAL

## 2021-04-05 MED ORDER — SILVER NITRATE-POT NITRATE 75-25 % EX MISC
CUTANEOUS | Status: AC
  Filled 2021-04-05: qty 10

## 2021-04-05 MED ORDER — ACETAMINOPHEN 10 MG/ML IV SOLN: 1000.0000 mg | Freq: Once | INTRAVENOUS | Status: DC | PRN

## 2021-04-05 MED ORDER — SUGAMMADEX SODIUM 500 MG/5ML IV SOLN
INTRAVENOUS | Status: DC | PRN
  Administered 2021-04-05: 310 mg via INTRAVENOUS

## 2021-04-05 MED ORDER — DOCUSATE SODIUM 100 MG PO CAPS: 100.0000 mg | ORAL_CAPSULE | Freq: Two times a day (BID) | ORAL | 0 refills | Status: AC | PRN

## 2021-04-05 MED ORDER — BACITRACIN ZINC 500 UNIT/GM EX OINT
TOPICAL_OINTMENT | CUTANEOUS | Status: AC
  Filled 2021-04-05: qty 1.8

## 2021-04-05 MED ORDER — LIDOCAINE-EPINEPHRINE 1 %-1:100000 IJ SOLN
INTRAMUSCULAR | Status: AC
  Filled 2021-04-05: qty 1

## 2021-04-05 MED ORDER — ONDANSETRON HCL 4 MG/2ML IJ SOLN
INTRAMUSCULAR | Status: DC | PRN
  Administered 2021-04-05: 4 mg via INTRAVENOUS

## 2021-04-05 MED ORDER — LIDOCAINE HCL (PF) 2 % IJ SOLN
INTRAMUSCULAR | Status: AC
  Filled 2021-04-05: qty 5

## 2021-04-05 MED ORDER — PROPOFOL 10 MG/ML IV BOLUS
INTRAVENOUS | Status: DC | PRN
  Administered 2021-04-05: 150 mg via INTRAVENOUS
  Administered 2021-04-05: 20 mg via INTRAVENOUS

## 2021-04-05 MED ORDER — FENTANYL CITRATE (PF) 100 MCG/2ML IJ SOLN
25.0000 ug | INTRAMUSCULAR | Status: DC | PRN
  Administered 2021-04-05 (×2): 25 ug via INTRAVENOUS

## 2021-04-05 MED ORDER — OXYCODONE HCL 5 MG/5ML PO SOLN
5.0000 mg | Freq: Once | ORAL | Status: DC | PRN
Start: 2021-04-05 — End: 2021-04-05

## 2021-04-05 MED ORDER — DEXAMETHASONE SODIUM PHOSPHATE 10 MG/ML IJ SOLN
INTRAMUSCULAR | Status: AC
  Filled 2021-04-05: qty 1

## 2021-04-05 MED ORDER — OXYMETAZOLINE HCL 0.05 % NA SOLN
NASAL | Status: AC
  Filled 2021-04-05: qty 30

## 2021-04-05 SURGICAL SUPPLY — 46 items
APL SWBSTK 6 STRL LF DISP (MISCELLANEOUS)
APPLICATOR COTTON TIP 6 STRL (MISCELLANEOUS) IMPLANT
APPLICATOR COTTON TIP 6IN STRL (MISCELLANEOUS)
BALL CTTN LRG ABS STRL LF (GAUZE/BANDAGES/DRESSINGS)
CANISTER SUCT 1200ML W/VALVE (MISCELLANEOUS) ×2 IMPLANT
CATH ROBINSON RED A/P 14FR (CATHETERS) IMPLANT
CNTNR URN SCR LID CUP LEK RST (MISCELLANEOUS) IMPLANT
COAGULATOR SUCT 8FR VV (MISCELLANEOUS) ×1 IMPLANT
COAGULATOR SUCT SWTCH 10FR 6 (ELECTROSURGICAL) IMPLANT
CONT SPEC 4OZ STRL OR WHT (MISCELLANEOUS)
COTTONBALL LRG STERILE PKG (GAUZE/BANDAGES/DRESSINGS) IMPLANT
COVER MAYO STAND STRL (DRAPES) ×2 IMPLANT
DECANTER SPIKE VIAL GLASS SM (MISCELLANEOUS) IMPLANT
DEFOGGER MIRROR 1QT (MISCELLANEOUS) ×2 IMPLANT
DRSG NASOPORE 8CM (GAUZE/BANDAGES/DRESSINGS) ×1 IMPLANT
DRSG TELFA 3X8 NADH (GAUZE/BANDAGES/DRESSINGS) IMPLANT
ELECT REM PT RETURN 9FT ADLT (ELECTROSURGICAL) ×2
ELECT REM PT RETURN 9FT PED (ELECTROSURGICAL)
ELECTRODE REM PT RETRN 9FT PED (ELECTROSURGICAL) IMPLANT
ELECTRODE REM PT RTRN 9FT ADLT (ELECTROSURGICAL) IMPLANT
GAUZE SPONGE 4X4 12PLY STRL LF (GAUZE/BANDAGES/DRESSINGS) ×2 IMPLANT
GLOVE SURG ENC MOIS LTX SZ6.5 (GLOVE) ×2 IMPLANT
GOWN STRL REUS W/ TWL LRG LVL3 (GOWN DISPOSABLE) ×2 IMPLANT
GOWN STRL REUS W/TWL LRG LVL3 (GOWN DISPOSABLE) ×4
NDL PRECISIONGLIDE 27X1.5 (NEEDLE) IMPLANT
NDL SPNL 25GX3.5 QUINCKE BL (NEEDLE) IMPLANT
NEEDLE PRECISIONGLIDE 27X1.5 (NEEDLE) ×2 IMPLANT
NEEDLE SPNL 25GX3.5 QUINCKE BL (NEEDLE) IMPLANT
NS IRRIG 1000ML POUR BTL (IV SOLUTION) ×2 IMPLANT
PACK BASIN DAY SURGERY FS (CUSTOM PROCEDURE TRAY) ×2 IMPLANT
PAD DRESSING TELFA 3X8 NADH (GAUZE/BANDAGES/DRESSINGS) IMPLANT
PATTIES SURGICAL .5 X3 (DISPOSABLE) ×2 IMPLANT
SHEET MEDIUM DRAPE 40X70 STRL (DRAPES) ×2 IMPLANT
SLEEVE SCD COMPRESS KNEE MED (STOCKING) ×2 IMPLANT
SOL ANTI FOG 6CC (MISCELLANEOUS) IMPLANT
SOLUTION ANTI FOG 6CC (MISCELLANEOUS) ×1
SPLINT NASAL AIRWAY SILICONE (MISCELLANEOUS) ×1 IMPLANT
SPONGE GAUZE 2X2 8PLY STRL LF (GAUZE/BANDAGES/DRESSINGS) IMPLANT
SPONGE TONSIL 1 RF SGL (DISPOSABLE) IMPLANT
SPONGE TONSIL 1.25 RF SGL STRG (GAUZE/BANDAGES/DRESSINGS) IMPLANT
SUT SILK 2 0 SH (SUTURE) ×1 IMPLANT
SYR BULB EAR ULCER 3OZ GRN STR (SYRINGE) IMPLANT
SYR CONTROL 10ML LL (SYRINGE) ×1 IMPLANT
TOWEL GREEN STERILE FF (TOWEL DISPOSABLE) ×2 IMPLANT
TUBE CONNECTING 20X1/4 (TUBING) ×2 IMPLANT
TUBE SALEM SUMP 16 FR W/ARV (TUBING) ×1 IMPLANT

## 2021-04-05 NOTE — H&P (Signed)
Randy [name] is an 75 y.o. male.    Chief Complaint:  Epistaxis  HPI: Patient presents today for planned elective procedure.  He/she denies any interval change in history since office visit on 017/14/2022:  Randy [name] is a 75 y.o. male who presents as a return patient, referred by Self, A Referral, for evaluation and treatment of epistaxis. Patient has history of hypertension and recurrent epistaxis, failing multiple attempts at cauterization and packing.  Patient noted to have a granuloma of the left superior caudal septum at time of my initial examination, which was cauterized using electrocautery on 12/20/2020.   Patient underwent chemical cauterization of the left caudal septum on 01/19/2021 due to concerns after finding a small dried bloody scab in the front of his nose.   He returned the following day due to a small amount of bleeding after accidentally blowing his nose. At time of last visit on 02/08/2021, patient had not had any further bleeding. He presents today for evaluation due to approximately 7 episodes of very brief, less than 2-minute, left-sided nasal bleeding. Most recently, this occurred on 02/26/2021. Patient states that he has continued to use nasal saline and Vaseline on a daily basis, and that these bleeding episodes occur randomly and stopped very quickly with application of Afrin and nasal pressure.  Past Medical History:  Diagnosis Date   Anxiety    HAS  PTSD   BPH (benign prostatic hypertrophy)    Cancer (HCC)    PROSTATE   Cataracts, bilateral    CKD (chronic kidney disease), stage III (HCC)    Cough    FOR 1 YEAR WITH THICK WHITE PHLEGM   Difficult intubation 2015   KEPT OVERNIGHT WITH LAST NECK SURGERY, TOLD TUBE WAS HARDER TO INSERT   ED (erectile dysfunction)    GERD (gastroesophageal reflux disease)    H/O bladder problems    Hepatitis    "yellow jaundice" - 1960's   History of adenomatous polyp of colon    History of condyloma acuminatum    History  of syncope    2003-- ? idiology--  per echo ef >55%   Hypertension    Insulin dependent type 2 diabetes mellitus ([place])    Lumbar herniated disc    Pulposus   Malfunction of penile prosthesis (HCC)    OA (osteoarthritis)    hips   OSA on CPAP    severe OSA  per study 07-01-2002  (done at [facility])  occasionally uses cpap   Peripheral neuropathy    Pneumonia 2015   PTSD (post-traumatic stress disorder)    PTSD (post-traumatic stress disorder)    Sleep apnea    Spondylolysis of cervical region    Wears glasses     Past Surgical History:  Procedure Laterality Date   ANTERIOR CERVICAL DECOMP/DISCECTOMY FUSION N/A 03/04/2014   Procedure: ANTERIOR CERVICAL DECOMPRESSION/DISCECTOMY FUSION 2 LEVELS;  Surgeon: Ophelia Charter, MD;  Location: [place] [facility];  Service: Neurosurgery;  Laterality: N/A;  C34 C45 anterior cervical decompression with fusion interbody prosthesis plating and bonegraft   CARPAL TUNNEL RELEASE Right 03/2004   CERVICAL FORAMENECTOMY  Nov 2005//   2000   right  C7 -- T1/   yr 2000 unknown level   HERNIA REPAIR     LUMBAR LAMINECTOMY/DECOMPRESSION MICRODISCECTOMY N/A 09/30/2017   Procedure: L2-3 LAMINECTOMY WITH MICRODISCECTOMY;  Surgeon: Marybelle Killings, MD;  Location: [place];  Service: Orthopedics;  Laterality: N/A;   PENILE PROSTHESIS IMPLANT N/A 03/04/2015   Procedure: REMOVAL AND REPLACMENT  PENILE PROTHESIS INFLATABLE COLOPLAST INFRAPUBIC APPROACH;  Surgeon: Kathie Rhodes, MD;  Location: [facility];  Service: Urology;  Laterality: N/A;   PENILE PROSTHESIS PLACEMENT  1998   REMOVAL CYST FROM CHEEK  08/2014   benign   ROBOT ASSISTED LAPAROSCOPIC RADICAL PROSTATECTOMY N/A 04/11/2016   Procedure: XI ROBOTIC ASSISTED LAPAROSCOPIC RADICAL PROSTATECTOMY;  Surgeon: Ardis Hughs, MD;  Location: [facility];  Service: Urology;  Laterality: N/A;    Family History  Problem Relation Age of Onset   Stroke Mother    Stroke Father     Social History:  reports that  he has been smoking cigarettes. He has a 25.50 pack-year smoking history. He has never used smokeless tobacco. He reports that he does not drink alcohol and does not use drugs.  Allergies:  Allergies  Allergen Reactions   Cetirizine & Related Other (See Comments)    Cotton mouth and trouble swallowing     Medications Prior to Admission  Medication Sig Dispense Refill   allopurinol (ZYLOPRIM) 100 MG tablet Take 100 mg by mouth at bedtime.     atorvastatin (LIPITOR) 40 MG tablet Take 40 mg by mouth at bedtime.      cyclobenzaprine (FLEXERIL) 10 MG tablet Take 10 mg by mouth daily as needed for muscle spasms.      furosemide (LASIX) 20 MG tablet Take 20 mg by mouth daily.     insulin NPH-regular Human (NOVOLIN 70/30) (70-30) 100 UNIT/ML injection Inject 46-48 Units into the skin 2 (two) times daily. 48 units in the morning and 46 units in the evening.     lisinopril (PRINIVIL,ZESTRIL) 40 MG tablet Take 20 mg by mouth every morning. Takes 0.5 tablet     Multiple Vitamins-Minerals (CENTRUM SILVER 50+MEN) TABS Take 1 tablet by mouth daily.     oxybutynin (DITROPAN) 5 MG tablet Take 5 mg by mouth 2 (two) times daily.     oxymetazoline (AFRIN NASAL SPRAY) 0.05 % nasal spray Place 3 sprays into both nostrils in the morning and at bedtime.     polyethylene glycol powder (GLYCOLAX/MIRALAX) powder Take 17 g by mouth daily as needed for mild constipation.     varenicline (CHANTIX) 0.5 MG tablet Take 0.5 mg by mouth 2 (two) times daily.     carboxymethylcellulose 1 % ophthalmic solution 1 drop 4 (four) times daily as needed (dry eyes).      cycloSPORINE (RESTASIS) 0.05 % ophthalmic emulsion Place 1 drop into both eyes 2 (two) times daily as needed (dry eyes).      dextrose (GLUTOSE) 40 % GEL Take 1 Tube by mouth as needed for low blood sugar.      Results for orders placed or performed during the hospital encounter of 04/05/21 (from the past 48 hour(s))  Glucose, capillary     Status: Abnormal    Collection Time: 04/05/21  7:29 AM  Result Value Ref Range   Glucose-Capillary 109 (H) 70 - 99 mg/dL    Comment: Glucose reference range applies only to samples taken after fasting for at least 8 hours.   Comment 1 Notify RN    Comment 2 Document in Chart    No results found.  ROS: ROS  Blood pressure 114/69, pulse 73, temperature 98.3 F (36.8 C), temperature source Oral, resp. rate 19, height '5\' 8"'$  (1.727 m), weight 77.5 kg, SpO2 100 %.  PHYSICAL EXAM: General: Well developed, well nourished. No acute distress. Voice intact, no voice breaks  Head/Face: Normocephalic, atraumatic. No scars or  lesions. No sinus tenderness. Facial nerve intact and equal bilaterally.  Eyes: Pupils are equal, round and reactive to light. Conjunctiva and lids are normal. Normal extraocular mobility.  Ears: Right: Pinna and external meatus normal Left: Pinna and external meatus normal  Nose: No gross deformity or lesions. No purulent discharge. Apparent regrowth of the left superior septal granuloma, with small amount of scabbing noted at central portion. No prominent blood vessels noted circumferentially, no evidence of active or recent bleeding.  Neck: Trachea midline.  Respiratory: No stridor or distress.  Extremities: No edema or cyanosis. Warm and well-perfused.  Neurologic: CN II-XII intact. Alert and oriented to self, place and time. Normal reflexes and motor skills, balance and coordination. Moving all extremities without gross abnormality.  Psychiatric: No unusual anxiety or evidence of depression. Appropriate affect.  Studies Reviewed: None   Assessment/Plan Chaden Battis is a 75 y.o. male with history of hypertension and recurrent epistaxis, failing multiple attempts at cauterization and packing.  Patient noted to have a granuloma of the left superior caudal septum at time of my initial examination, which was cauterized using electrocautery on 12/20/2020.  There is regrowth of the left septal  granuloma, which is likely the source of his ongoing intermittent epistaxis. To OR today for nasal endoscopy with biopsy and control of epistaxis under general anesthesia. Risks, benefits, alternatives of the planned procedure were reviewed   Modine Oppenheimer A Neizan Debruhl 04/05/2021, 8:26 AM

## 2021-04-05 NOTE — Anesthesia Procedure Notes (Signed)
Procedure Name: Intubation Date/Time: 04/05/2021 8:47 AM Performed by: Ezequiel Kayser, CRNA Pre-anesthesia Checklist: Patient identified, Emergency Drugs available, Suction available and Patient being monitored Patient Re-evaluated:Patient Re-evaluated prior to induction Oxygen Delivery Method: Circle System Utilized Preoxygenation: Pre-oxygenation with 100% oxygen Induction Type: IV induction Ventilation: Mask ventilation without difficulty Laryngoscope Size: Mac and 4 Grade View: Grade I Tube type: Oral Rae Tube size: 8.0 mm Number of attempts: 1 Airway Equipment and Method: Stylet and Oral airway Placement Confirmation: ETT inserted through vocal cords under direct vision, positive ETCO2 and breath sounds checked- equal and bilateral Tube secured with: Tape Dental Injury: Teeth and Oropharynx as per pre-operative assessment

## 2021-04-05 NOTE — Transfer of Care (Signed)
Immediate Anesthesia Transfer of Care Note  Patient: Randy [name]  Procedure(s) Performed: EPISTAXIS CONTROL (Left: Nose) NASAL ENDOSCOPY With Biopsy (Left: Nose)  Patient Location: PACU  Anesthesia Type:General  Level of Consciousness: drowsy  Airway & Oxygen Therapy: Patient Spontanous Breathing and Patient connected to face mask oxygen  Post-op Assessment: Report given to RN and Post -op Vital signs reviewed and stable  Post vital signs: Reviewed and stable  Last Vitals:  Vitals Value Taken Time  BP 129/80 04/05/21 0926  Temp    Pulse 74 04/05/21 0927  Resp 17 04/05/21 0927  SpO2 99 % 04/05/21 0927  Vitals shown include unvalidated device data.  Last Pain:  Vitals:   04/05/21 0742  TempSrc: Oral  PainSc: 0-No pain         Complications: No notable events documented.

## 2021-04-05 NOTE — Discharge Instructions (Addendum)
Instructions:  1. Limited activity - no lifting over 10 lbs, no straining, no nasal trauma, no nasal manipulation 2. Liquid and soft diet, advance as tolerated 3. May bathe and shower, keep splint dry for 1 week 4. Saline nasal spray - 4 puffs/nostril every 4 hours while awake, begin the morning after surgery 5. Elevate Head of Bed 6. No nose blowing/Open mouth sneeze 7. Pain medication as needed. Take stool softener if using prescription. If pain not severe, alternate Tylenol and ibuprofen every 6 hours as needed for pain.  Call [facility]NT for any questions or concerns: [phone]    Post Anesthesia Home Care Instructions  Activity: Get plenty of rest for the remainder of the day. A responsible individual must stay with you for 24 hours following the procedure.  For the next 24 hours, DO NOT: -Drive a car -Paediatric nurse -Drink alcoholic beverages -Take any medication unless instructed by your physician -Make any legal decisions or sign important papers.  Meals: Start with liquid foods such as gelatin or soup. Progress to regular foods as tolerated. Avoid greasy, spicy, heavy foods. If nausea and/or vomiting occur, drink only clear liquids until the nausea and/or vomiting subsides. Call your physician if vomiting continues.  Special Instructions/Symptoms: Your throat may feel dry or sore from the anesthesia or the breathing tube placed in your throat during surgery. If this causes discomfort, gargle with warm salt water. The discomfort should disappear within 24 hours.  If you had a scopolamine patch placed behind your ear for the management of post- operative nausea and/or vomiting:  1. The medication in the patch is effective for 72 hours, after which it should be removed.  Wrap patch in a tissue and discard in the trash. Wash hands thoroughly with soap and water. 2. You may remove the patch earlier than 72 hours if you experience unpleasant side effects which may  include dry mouth, dizziness or visual disturbances. 3. Avoid touching the patch. Wash your hands with soap and water after contact with the patch.

## 2021-04-05 NOTE — Anesthesia Postprocedure Evaluation (Signed)
Anesthesia Post Note  Patient: Randy [name]  Procedure(s) Performed: EPISTAXIS CONTROL (Left: Nose) NASAL ENDOSCOPY With Biopsy (Left: Nose)     Patient location during evaluation: PACU Anesthesia Type: General Level of consciousness: awake and alert Pain management: pain level controlled Vital Signs Assessment: post-procedure vital signs reviewed and stable Respiratory status: spontaneous breathing, nonlabored ventilation, respiratory function stable and patient connected to nasal cannula oxygen Cardiovascular status: blood pressure returned to baseline and stable Postop Assessment: no apparent nausea or vomiting Anesthetic complications: no   No notable events documented.  Last Vitals:  Vitals:   04/05/21 0945 04/05/21 1004  BP: 126/87 129/75  Pulse: 75 77  Resp: 13 15  Temp:  (!) 36.2 C  SpO2: 95% 97%    Last Pain:  Vitals:   04/05/21 0959  TempSrc:   PainSc: 0-No pain                 Luevenia Mcavoy S

## 2021-04-05 NOTE — Anesthesia Preprocedure Evaluation (Signed)
Anesthesia Evaluation  Patient identified by MRN, date of birth, ID band Patient awake    Reviewed: Allergy & Precautions, NPO status , Patient's Chart, lab work & pertinent test results  History of Anesthesia Complications (+) DIFFICULT AIRWAY  Airway Mallampati: III  TM Distance: >3 FB Neck ROM: Limited    Dental no notable dental hx.    Pulmonary sleep apnea and Continuous Positive Airway Pressure Ventilation , Current Smoker and Patient abstained from smoking.,    Pulmonary exam normal breath sounds clear to auscultation       Cardiovascular hypertension, Normal cardiovascular exam Rhythm:Regular Rate:Normal     Neuro/Psych negative neurological ROS  negative psych ROS   GI/Hepatic Neg liver ROS, GERD  ,  Endo/Other  diabetes, Type 2, Insulin Dependent  Renal/GU Renal InsufficiencyRenal disease  negative genitourinary   Musculoskeletal negative musculoskeletal ROS (+)   Abdominal   Peds negative pediatric ROS (+)  Hematology negative hematology ROS (+)   Anesthesia Other Findings   Reproductive/Obstetrics negative OB ROS                             Anesthesia Physical Anesthesia Plan  ASA: 3  Anesthesia Plan: General   Post-op Pain Management:    Induction: Intravenous  PONV Risk Score and Plan: 1 and Ondansetron, Dexamethasone and Treatment may vary due to age or medical condition  Airway Management Planned: Oral ETT  Additional Equipment:   Intra-op Plan:   Post-operative Plan: Extubation in OR  Informed Consent: I have reviewed the patients History and Physical, chart, labs and discussed the procedure including the risks, benefits and alternatives for the proposed anesthesia with the patient or authorized representative who has indicated his/her understanding and acceptance.     Dental advisory given  Plan Discussed with: CRNA and Surgeon  Anesthesia Plan  Comments: (Grade 1 with Miller 3 and glidescope 4)        Anesthesia Quick Evaluation

## 2021-04-05 NOTE — Op Note (Signed)
OPERATIVE NOTE  Mahamud Frenz Date/Time of Admission: 04/05/2021  7:08 AM  CSN: [id number] Attending Provider: Ebbie Latus A, DO Room/Bed: [facility]SP/NONE DOB: 09-17-1945 Age: 75 y.o.   Pre-Op Diagnosis: epistaxis, nasal biopsy  Post-Op Diagnosis: epistaxis, nasal biopsy  Procedure: Procedure(s): EPISTAXIS CONTROL NASAL ENDOSCOPY With Biopsy  Anesthesia: General  Surgeon(s): Camerin Jimenez A Shellia Hartl, DO  Staff: Circulator: Ted Mcalpine, RN Scrub Person: Izora Ribas, RN  Implants: * No implants in log *  Specimens: ID Type Source Tests Collected by Time Destination  1 : Left septal lesion Tissue PATH ENT biopsy SURGICAL PATHOLOGY Shallyn Constancio A, [place] [id number]     Complications: None  EBL: <10 ML  Condition: stable  Operative Findings:  Soft tissue lesion of the superior aspect of the left caudal septum, friable. No other soft tissue abnormality or source of epistaxis identified.  Description of Operation: Once operative consent was obtained and the site and surgery were confirmed with the patient and the operating room team, the patient was brought back to the operating room and general endotracheal anesthesia was obtained. Lidocaine 1% with 1:100,000 epinephrine was injected into the nasal septum on the left around the identified lesion. Afrin-soaked pledgets were placed into the nasal cavity, and the patient was prepped and draped in sterile fashion. Attention was first turned to the left nasal vestibule. The lesion of the left caudal septum was sharply excised and dissected free from the underlying perichondrium using a caudal elevator, and sent as a specimen to pathology.  The zero degree endoscope was then used to inspect bilateral nasal cavities. No abnormal anatomy or other active bleeding was appreciated. Suction cautery was then used to control bleeding at the caudal septum. The contralateral mucosa was inspected and noted to be  normal in appearance. Doyle nasal splints covered in Bacitracin ointment were placed in the bilateral nasal cavities and sutured to the columella with a 2-0 silk suture and Nasopore was placed in the left nasal cavity, lateral to the Doyle splint. orogastric tube was placed and the stomach cavity was suctioned to reduce postoperative nausea. The patient was turned over to anesthesia service and was extubated in the operating room and transferred to the PACU in stable condition. The patient will be discharged today and followed up in the ENT clinic in ~2 weeks for postoperative check and splint removal.    [place]  04/05/2021

## 2021-04-06 ENCOUNTER — Encounter (HOSPITAL_BASED_OUTPATIENT_CLINIC_OR_DEPARTMENT_OTHER): Payer: Self-pay | Admitting: Otolaryngology

## 2021-04-07 LAB — SURGICAL PATHOLOGY

## 2021-04-21 ENCOUNTER — Telehealth: Payer: Self-pay | Admitting: Orthopaedic Surgery

## 2021-04-21 NOTE — Telephone Encounter (Signed)
03/23/21 ov note faxed to [facility]

## 2022-03-16 DIAGNOSIS — E109 Type 1 diabetes mellitus without complications: Secondary | ICD-10-CM | POA: Diagnosis not present

## 2022-04-12 DIAGNOSIS — G4733 Obstructive sleep apnea (adult) (pediatric): Secondary | ICD-10-CM | POA: Diagnosis not present

## 2022-04-15 DIAGNOSIS — E109 Type 1 diabetes mellitus without complications: Secondary | ICD-10-CM | POA: Diagnosis not present

## 2022-04-23 DIAGNOSIS — L708 Other acne: Secondary | ICD-10-CM | POA: Diagnosis not present

## 2022-05-13 DIAGNOSIS — G4733 Obstructive sleep apnea (adult) (pediatric): Secondary | ICD-10-CM | POA: Diagnosis not present

## 2022-05-15 DIAGNOSIS — E109 Type 1 diabetes mellitus without complications: Secondary | ICD-10-CM | POA: Diagnosis not present

## 2022-06-01 ENCOUNTER — Encounter: Payer: Self-pay | Admitting: Gastroenterology

## 2022-06-12 DIAGNOSIS — G4733 Obstructive sleep apnea (adult) (pediatric): Secondary | ICD-10-CM | POA: Diagnosis not present

## 2022-06-19 ENCOUNTER — Ambulatory Visit (AMBULATORY_SURGERY_CENTER): Payer: Self-pay | Admitting: *Deleted

## 2022-06-19 VITALS — Ht 68.0 in | Wt 164.0 lb

## 2022-06-19 DIAGNOSIS — Z86010 Personal history of colonic polyps: Secondary | ICD-10-CM

## 2022-06-19 MED ORDER — PEG 3350-KCL-NA BICARB-NACL 420 G PO SOLR
4000.0000 mL | Freq: Once | ORAL | 0 refills | Status: AC
Start: 2022-06-19 — End: 2022-06-19

## 2022-06-19 NOTE — Progress Notes (Signed)

## 2022-06-22 DIAGNOSIS — E109 Type 1 diabetes mellitus without complications: Secondary | ICD-10-CM | POA: Diagnosis not present

## 2022-07-03 ENCOUNTER — Telehealth: Payer: Self-pay | Admitting: Gastroenterology

## 2022-07-03 DIAGNOSIS — Z86010 Personal history of colonic polyps: Secondary | ICD-10-CM

## 2022-07-03 MED ORDER — NA SULFATE-K SULFATE-MG SULF 17.5-3.13-1.6 GM/177ML PO SOLN
1.0000 | Freq: Once | ORAL | 0 refills | Status: AC
Start: 2022-07-03 — End: 2022-07-03

## 2022-07-03 NOTE — Telephone Encounter (Signed)
Message left that suprep sent into [facility] in [place],call back # left if pt has any other concerns or related prep issues.

## 2022-07-03 NOTE — Telephone Encounter (Signed)
Inbound call from patient stating that he is currently at the pharmacy and is requesting Suprep to be sent to the pharmacy. Patient stated the pharmacy advised him Suprep would be better tasting for him. Patient is requesting prescription to be sent ASAP since he is currently there now. Please advise.

## 2022-07-08 ENCOUNTER — Encounter: Payer: Self-pay | Admitting: Certified Registered Nurse Anesthetist

## 2022-07-12 DIAGNOSIS — Z01818 Encounter for other preprocedural examination: Secondary | ICD-10-CM | POA: Diagnosis not present

## 2022-07-13 DIAGNOSIS — G4733 Obstructive sleep apnea (adult) (pediatric): Secondary | ICD-10-CM | POA: Diagnosis not present

## 2022-07-16 ENCOUNTER — Ambulatory Visit (AMBULATORY_SURGERY_CENTER): Payer: No Typology Code available for payment source | Admitting: Gastroenterology

## 2022-07-16 ENCOUNTER — Encounter: Payer: Self-pay | Admitting: Gastroenterology

## 2022-07-16 VITALS — BP 106/63 | HR 65 | Temp 98.4°F | Resp 13 | Ht 68.0 in | Wt 164.0 lb

## 2022-07-16 DIAGNOSIS — D124 Benign neoplasm of descending colon: Secondary | ICD-10-CM

## 2022-07-16 DIAGNOSIS — Z09 Encounter for follow-up examination after completed treatment for conditions other than malignant neoplasm: Secondary | ICD-10-CM

## 2022-07-16 DIAGNOSIS — D123 Benign neoplasm of transverse colon: Secondary | ICD-10-CM

## 2022-07-16 DIAGNOSIS — Z86010 Personal history of colonic polyps: Secondary | ICD-10-CM

## 2022-07-16 MED ORDER — SODIUM CHLORIDE 0.9 % IV SOLN: 500.0000 mL | Freq: Once | INTRAVENOUS | Status: DC

## 2022-07-16 NOTE — Progress Notes (Signed)
Pt's states no medical or surgical changes since previsit or office visit. 

## 2022-07-16 NOTE — Progress Notes (Signed)
Report given to PACU, vss 

## 2022-07-16 NOTE — Progress Notes (Signed)
[facility] History and Physical   Primary Care Physician:  Rolley Sims, MD   Reason for Procedure:   [facility] -  due for repeat colonoscopy  Plan:    COLON     HPI: Randy [name] is a 76 y.o. male    Past Medical History:  Diagnosis Date   Anxiety    HAS  PTSD   BPH (benign prostatic hypertrophy)    Cancer ([place])    PROSTATE   Cataracts, bilateral    CKD (chronic kidney disease), stage III (HCC)    Cough    FOR 1 YEAR WITH THICK WHITE PHLEGM   Difficult airway for intubation, subsequent encounter 09/30/2017   DLx1, miller 3, grade 1   Difficult intubation 2015   KEPT OVERNIGHT WITH LAST NECK SURGERY, TOLD TUBE WAS HARDER TO INSERT   ED (erectile dysfunction)    GERD (gastroesophageal reflux disease)    H/O bladder problems    Hepatitis    "yellow jaundice" - 1960's   History of adenomatous polyp of colon    History of condyloma acuminatum    History of syncope    2003-- ? idiology--  per echo ef >55%   Hyperlipidemia    Hypertension    Insulin dependent type 2 diabetes mellitus ([place])    Lumbar herniated disc    Pulposus   Malfunction of penile prosthesis (HCC)    OA (osteoarthritis)    hips   OSA on CPAP    severe OSA  per study 07-01-2002  (done at [facility])  occasionally uses cpap   Peripheral neuropathy    Pneumonia 2015   PTSD (post-traumatic stress disorder)    PTSD (post-traumatic stress disorder)    Sleep apnea    Spondylolysis of cervical region    Wears glasses     Past Surgical History:  Procedure Laterality Date   ANTERIOR CERVICAL DECOMP/DISCECTOMY FUSION N/A 03/04/2014   Procedure: ANTERIOR CERVICAL DECOMPRESSION/DISCECTOMY FUSION 2 LEVELS;  Surgeon: Ophelia Charter, MD;  Location: [facility];  Service: Neurosurgery;  Laterality: N/A;  C34 C45 anterior cervical decompression with fusion interbody prosthesis plating and bonegraft   CARPAL TUNNEL RELEASE Right 03/2004   CERVICAL FORAMENECTOMY  Nov 2005//   2000    right  C7 -- T1/   yr 2000 unknown level   COLONOSCOPY     HERNIA REPAIR     LUMBAR LAMINECTOMY/DECOMPRESSION MICRODISCECTOMY N/A 09/30/2017   Procedure: L2-3 LAMINECTOMY WITH MICRODISCECTOMY;  Surgeon: Marybelle Killings, MD;  Location: [facility];  Service: Orthopedics;  Laterality: N/A;   NASAL ENDOSCOPY Left 04/05/2021   Procedure: NASAL ENDOSCOPY With Biopsy;  Surgeon: Jason Coop, DO;  Location: [place];  Service: ENT;  Laterality: Left;   NASAL HEMORRHAGE CONTROL Left 04/05/2021   Procedure: EPISTAXIS CONTROL;  Surgeon: Jason Coop, DO;  Location: [place];  Service: ENT;  Laterality: Left;   PENILE PROSTHESIS IMPLANT N/A 03/04/2015   Procedure: REMOVAL AND REPLACMENT PENILE PROTHESIS INFLATABLE COLOPLAST INFRAPUBIC APPROACH;  Surgeon: Kathie Rhodes, MD;  Location: [place];  Service: Urology;  Laterality: N/A;   PENILE PROSTHESIS PLACEMENT  1998   REMOVAL CYST FROM CHEEK  08/2014   benign   ROBOT ASSISTED LAPAROSCOPIC RADICAL PROSTATECTOMY N/A 04/11/2016   Procedure: XI ROBOTIC ASSISTED LAPAROSCOPIC RADICAL PROSTATECTOMY;  Surgeon: Ardis Hughs, MD;  Location: [facility];  Service: Urology;  Laterality: N/A;    Prior to Admission medications  Medication Sig Start Date End Date Taking? Authorizing Provider  allopurinol (ZYLOPRIM) 100 MG tablet Take 100 mg by mouth at bedtime.   Yes [provider]  aspirin 81 MG chewable tablet Chew 1 tablet by mouth daily.   Yes [provider]  atorvastatin (LIPITOR) 40 MG tablet Take 40 mg by mouth at bedtime.    Yes [provider]  clotrimazole (LOTRIMIN) 1 % cream Apply topically.   Yes [provider]  furosemide (LASIX) 20 MG tablet Take 20 mg by mouth daily.   Yes [provider]  lisinopril (PRINIVIL,ZESTRIL) 40 MG tablet Take 20 mg by mouth every morning. Takes 0.5 tablet   Yes [provider]  Multiple Vitamins-Minerals  (CENTRUM SILVER 50+MEN) TABS Take 1 tablet by mouth daily.   Yes [provider]  oxybutynin (DITROPAN) 5 MG tablet Take 5 mg by mouth 2 (two) times daily.   Yes [provider]  oxymetazoline (AFRIN NASAL SPRAY) 0.05 % nasal spray Place 3 sprays into both nostrils in the morning and at bedtime. 11/12/20  Yes [provider]  carboxymethylcellulose 1 % ophthalmic solution 1 drop 4 (four) times daily as needed (dry eyes).  Patient not taking: Reported on 07/16/2022    [provider]  cyclobenzaprine (FLEXERIL) 10 MG tablet Take 10 mg by mouth daily as needed for muscle spasms.  Patient not taking: Reported on 06/19/2022    [provider]  cycloSPORINE (RESTASIS) 0.05 % ophthalmic emulsion Place 1 drop into both eyes 2 (two) times daily as needed (dry eyes).  Patient not taking: Reported on 06/19/2022    [provider]  dextrose (GLUTOSE) 40 % GEL Take 1 Tube by mouth as needed for low blood sugar. Patient not taking: Reported on 06/19/2022    [provider]  insulin NPH-regular Human (NOVOLIN 70/30) (70-30) 100 UNIT/ML injection Inject 46-48 Units into the skin 2 (two) times daily. 48 units in the morning and 46 units in the evening.    [provider]  polyethylene glycol powder (GLYCOLAX/MIRALAX) powder Take 17 g by mouth daily as needed for mild constipation.    [provider]  varenicline (CHANTIX) 0.5 MG tablet Take 0.5 mg by mouth 2 (two) times daily. Patient not taking: Reported on 06/19/2022    [provider]    Current Outpatient Medications  Medication Sig Dispense Refill   allopurinol (ZYLOPRIM) 100 MG tablet Take 100 mg by mouth at bedtime.     aspirin 81 MG chewable tablet Chew 1 tablet by mouth daily.     atorvastatin (LIPITOR) 40 MG tablet Take 40 mg by mouth at bedtime.      clotrimazole (LOTRIMIN) 1 % cream Apply topically.     furosemide (LASIX) 20 MG tablet Take 20 mg by mouth  daily.     lisinopril (PRINIVIL,ZESTRIL) 40 MG tablet Take 20 mg by mouth every morning. Takes 0.5 tablet     Multiple Vitamins-Minerals (CENTRUM SILVER 50+MEN) TABS Take 1 tablet by mouth daily.     oxybutynin (DITROPAN) 5 MG tablet Take 5 mg by mouth 2 (two) times daily.     oxymetazoline (AFRIN NASAL SPRAY) 0.05 % nasal spray Place 3 sprays into both nostrils in the morning and at bedtime.     carboxymethylcellulose 1 % ophthalmic solution 1 drop 4 (four) times daily as needed (dry eyes).  (Patient not taking: Reported on 07/16/2022)     cyclobenzaprine (FLEXERIL) 10 MG tablet Take 10 mg by mouth daily as  needed for muscle spasms.  (Patient not taking: Reported on 06/19/2022)     cycloSPORINE (RESTASIS) 0.05 % ophthalmic emulsion Place 1 drop into both eyes 2 (two) times daily as needed (dry eyes).  (Patient not taking: Reported on 06/19/2022)     dextrose (GLUTOSE) 40 % GEL Take 1 Tube by mouth as needed for low blood sugar. (Patient not taking: Reported on 06/19/2022)     insulin NPH-regular Human (NOVOLIN 70/30) (70-30) 100 UNIT/ML injection Inject 46-48 Units into the skin 2 (two) times daily. 48 units in the morning and 46 units in the evening.     polyethylene glycol powder (GLYCOLAX/MIRALAX) powder Take 17 g by mouth daily as needed for mild constipation.     varenicline (CHANTIX) 0.5 MG tablet Take 0.5 mg by mouth 2 (two) times daily. (Patient not taking: Reported on 06/19/2022)     No current facility-administered medications for this visit.    Allergies as of 07/16/2022 - Review Complete 07/16/2022  Allergen Reaction Noted   Cetirizine & related Other (See Comments) 09/19/2017    Family History  Problem Relation Age of Onset   Stroke Mother    Stroke Father    Colon cancer Neg Hx    Colon polyps Neg Hx    Crohn's disease Neg Hx    Esophageal cancer Neg Hx    Rectal cancer Neg Hx    Stomach cancer Neg Hx    Ulcerative colitis Neg Hx     Social History   Socioeconomic  History   Marital status: Legally Separated    Spouse name: Not on file   Number of children: Not on file   Years of education: Not on file   Highest education level: Not on file  Occupational History   Not on file  Tobacco Use   Smoking status: Every Day    Packs/day: 0.50    Years: 51.00    Total pack years: 25.50    Types: Cigarettes    Passive exposure: Past   Smokeless tobacco: Never  Vaping Use   Vaping Use: Never used  Substance and Sexual Activity   Alcohol use: No    Comment: very  rare   moonshine   Drug use: No   Sexual activity: Not on file  Other Topics Concern   Not on file  Social History Narrative   Not on file   Social Determinants of Health   Financial Resource Strain: Not on file  Food Insecurity: Not on file  Transportation Needs: Not on file  Physical Activity: Not on file  Stress: Not on file  Social Connections: Not on file  Intimate Partner Violence: Not on file    Review of Systems: Positive for NONE All other review of systems negative except as mentioned in the HPI.  Physical Exam: Vital signs in last 24 hours: '@VSRANGES'$ @   General:   Alert,  Well-developed, well-nourished, pleasant and cooperative in NAD Lungs:  Clear throughout to auscultation.   Heart:  Regular rate and rhythm; no murmurs, clicks, rubs,  or gallops. Abdomen:  Soft, nontender and nondistended. Normal bowel sounds.   Neuro/Psych:  Alert and cooperative. Normal mood and affect. A and O x 3    No significant changes were identified.  The patient continues to be an appropriate candidate for the planned procedure and anesthesia.   Carmell Austria, MD. [facility] 07/16/2022 11:18 AM@

## 2022-07-16 NOTE — Op Note (Signed)
[facility] Patient Name: Randy [name] Procedure Date: 07/16/2022 8:26 AM MRN: [id number] Endoscopist: Jackquline Denmark , MD, [id number] Age: 76 Referring MD:  Date of Birth: Sep 21, 1945 Gender: Male Account #: [id number] Procedure:                Colonoscopy Indications:              High risk colon cancer surveillance: Personal                            history of colonic polyps Medicines:                Monitored Anesthesia Care Procedure:                Pre-Anesthesia Assessment:                           - Prior to the procedure, a History and Physical                            was performed, and patient medications and                            allergies were reviewed. The patient's tolerance of                            previous anesthesia was also reviewed. The risks                            and benefits of the procedure and the sedation                            options and risks were discussed with the patient.                            All questions were answered, and informed consent                            was obtained. Prior Anticoagulants: The patient has                            taken no anticoagulant or antiplatelet agents. ASA                            Grade Assessment: II - A patient with mild systemic                            disease. After reviewing the risks and benefits,                            the patient was deemed in satisfactory condition to                            undergo the procedure.  After obtaining informed consent, the colonoscope                            was passed under direct vision. Throughout the                            procedure, the patient's blood pressure, pulse, and                            oxygen saturations were monitored continuously. The                            Olympus PCF-H190DL (#[id number]) Colonoscope was                            introduced through the anus and  advanced to the the                            cecum, identified by appendiceal orifice and                            ileocecal valve. The colonoscopy was performed                            without difficulty. The patient tolerated the                            procedure well. The quality of the bowel                            preparation was adequate to identify polyps. The                            ileocecal valve, appendiceal orifice, and rectum                            were photographed. Scope In: 8:39:31 AM Scope Out: 9:32:35 AM Total Procedure Duration: 0 hours 17 minutes 10 seconds  Findings:                 Three sessile polyps were found in the proximal                            transverse colon. The polyps were 4 to 6 mm in                            size. These polyps were removed with a cold snare.                            Resection and retrieval were complete.                           Two sessile polyps were found in the proximal  descending colon and distal transverse colon. The                            polyps were 4 to 6 mm in size. These polyps were                            removed with a cold snare. Resection and retrieval                            were complete.                           Multiple medium-mouthed diverticula were found in                            the sigmoid colon, ascending colon and single in                            cecum.                           Non-bleeding internal hemorrhoids were found during                            retroflexion. The hemorrhoids were small and Grade                            I (internal hemorrhoids that do not prolapse).                           The exam was otherwise without abnormality on                            direct and retroflexion views. Complications:            No immediate complications. Estimated Blood Loss:     Estimated blood loss: none. Impression:                - Three 4 to 6 mm polyps in the proximal transverse                            colon, removed with a cold snare. Resected and                            retrieved.                           - Two 4 to 6 mm polyps in the proximal descending                            colon and in the distal transverse colon, removed                            with a cold snare. Resected and retrieved.                           -  Mild pancolonic diverticulosis.                           - Non-bleeding internal hemorrhoids.                           - The examination was otherwise normal on direct                            and retroflexion views. Recommendation:           - Patient has a contact number available for                            emergencies. The signs and symptoms of potential                            delayed complications were discussed with the                            patient. Return to normal activities tomorrow.                            Written discharge instructions were provided to the                            patient.                           - High fiber diet.                           - Continue present medications.                           - Await pathology results.                           - Repeat colonoscopy for surveillance based on                            pathology results.                           - The findings and recommendations were discussed                            with the patient's family. Jackquline Denmark, MD 07/16/2022 9:01:07 AM This report has been signed electronically.

## 2022-07-16 NOTE — Progress Notes (Signed)
Called to room to assist during endoscopic procedure.  Patient ID and intended procedure confirmed with present staff. Received instructions for my participation in the procedure from the performing physician.  

## 2022-07-16 NOTE — Patient Instructions (Signed)
Handouts on polyps, hemorrhoids, diverticulosis, and high fiber diet given to you today  Await pathology results  YOU HAD AN ENDOSCOPIC PROCEDURE TODAY AT [facility]:   Refer to the procedure report that was given to you for any specific questions about what was found during the examination.  If the procedure report does not answer your questions, please call your gastroenterologist to clarify.  If you requested that your care partner not be given the details of your procedure findings, then the procedure report has been included in a sealed envelope for you to review at your convenience later.  YOU SHOULD EXPECT: Some feelings of bloating in the abdomen. Passage of more gas than usual.  Walking can help get rid of the air that was put into your GI tract during the procedure and reduce the bloating. If you had a lower endoscopy (such as a colonoscopy or flexible sigmoidoscopy) you may notice spotting of blood in your stool or on the toilet paper. If you underwent a bowel prep for your procedure, you may not have a normal bowel movement for a few days.  Please Note:  You might notice some irritation and congestion in your nose or some drainage.  This is from the oxygen used during your procedure.  There is no need for concern and it should clear up in a day or so.  SYMPTOMS TO REPORT IMMEDIATELY:  Following lower endoscopy (colonoscopy or flexible sigmoidoscopy):  Excessive amounts of blood in the stool  Significant tenderness or worsening of abdominal pains  Swelling of the abdomen that is new, acute  Fever of 100F or higher  Following upper endoscopy (EGD)  Vomiting of blood or coffee ground material  New chest pain or pain under the shoulder blades  Painful or persistently difficult swallowing  New shortness of breath  Fever of 100F or higher  Black, tarry-looking stools  For urgent or emergent issues, a gastroenterologist can be reached at any hour by calling [phone]  [phone]. Do not use MyChart messaging for urgent concerns.    DIET:  We do recommend a small meal at first, but then you may proceed to your regular diet.  Drink plenty of fluids but you should avoid alcoholic beverages for 24 hours.  ACTIVITY:  You should plan to take it easy for the rest of today and you should NOT DRIVE or use heavy machinery until tomorrow (because of the sedation medicines used during the test).    FOLLOW UP: Our staff will call the number listed on your records the next business day following your procedure.  We will call around 7:15- 8:00 am to check on you and address any questions or concerns that you may have regarding the information given to you following your procedure. If we do not reach you, we will leave a message.     If any biopsies were taken you will be contacted by phone or by letter within the next 1-3 weeks.  Please call us at [phone] if you have not heard about the biopsies in 3 weeks.    SIGNATURES/CONFIDENTIALITY: You and/or your care partner have signed paperwork which will be entered into your electronic medical record.  These signatures attest to the fact that that the information above on your After Visit Summary has been reviewed and is understood.  Full responsibility of the confidentiality of this discharge information lies with you and/or your care-partner.

## 2022-07-17 ENCOUNTER — Telehealth: Payer: Self-pay | Admitting: *Deleted

## 2022-07-17 NOTE — Telephone Encounter (Signed)
  Follow up Call-     07/16/2022    7:57 AM  Call back number  Post procedure Call Back phone  # [phone]  Permission to leave phone message Yes     Patient questions:  Do you have a fever, pain , or abdominal swelling? No. Pain Score  0 *  Have you tolerated food without any problems? Yes.    Have you been able to return to your normal activities? Yes.    Do you have any questions about your discharge instructions: Diet   No. Medications  No. Follow up visit  No.  Do you have questions or concerns about your Care? No.  Actions: * If pain score is 4 or above: No action needed, pain <4.

## 2022-07-22 DIAGNOSIS — E109 Type 1 diabetes mellitus without complications: Secondary | ICD-10-CM | POA: Diagnosis not present

## 2022-07-26 DIAGNOSIS — H25812 Combined forms of age-related cataract, left eye: Secondary | ICD-10-CM | POA: Diagnosis not present

## 2022-07-29 ENCOUNTER — Encounter: Payer: Self-pay | Admitting: Gastroenterology

## 2022-08-09 DIAGNOSIS — H25811 Combined forms of age-related cataract, right eye: Secondary | ICD-10-CM | POA: Diagnosis not present

## 2022-08-12 DIAGNOSIS — G4733 Obstructive sleep apnea (adult) (pediatric): Secondary | ICD-10-CM | POA: Diagnosis not present

## 2022-08-21 DIAGNOSIS — E109 Type 1 diabetes mellitus without complications: Secondary | ICD-10-CM | POA: Diagnosis not present

## 2022-08-30 ENCOUNTER — Encounter (HOSPITAL_BASED_OUTPATIENT_CLINIC_OR_DEPARTMENT_OTHER): Payer: No Typology Code available for payment source | Attending: Internal Medicine | Admitting: Internal Medicine

## 2022-08-30 DIAGNOSIS — Z794 Long term (current) use of insulin: Secondary | ICD-10-CM | POA: Diagnosis not present

## 2022-08-30 DIAGNOSIS — E1151 Type 2 diabetes mellitus with diabetic peripheral angiopathy without gangrene: Secondary | ICD-10-CM | POA: Diagnosis not present

## 2022-08-30 DIAGNOSIS — E1140 Type 2 diabetes mellitus with diabetic neuropathy, unspecified: Secondary | ICD-10-CM | POA: Diagnosis present

## 2022-08-30 DIAGNOSIS — C61 Malignant neoplasm of prostate: Secondary | ICD-10-CM

## 2022-08-30 DIAGNOSIS — N183 Chronic kidney disease, stage 3 unspecified: Secondary | ICD-10-CM | POA: Insufficient documentation

## 2022-08-30 DIAGNOSIS — I129 Hypertensive chronic kidney disease with stage 1 through stage 4 chronic kidney disease, or unspecified chronic kidney disease: Secondary | ICD-10-CM | POA: Insufficient documentation

## 2022-08-30 DIAGNOSIS — I10 Essential (primary) hypertension: Secondary | ICD-10-CM

## 2022-08-30 DIAGNOSIS — I872 Venous insufficiency (chronic) (peripheral): Secondary | ICD-10-CM | POA: Insufficient documentation

## 2022-08-30 DIAGNOSIS — F17200 Nicotine dependence, unspecified, uncomplicated: Secondary | ICD-10-CM | POA: Insufficient documentation

## 2022-08-30 DIAGNOSIS — I739 Peripheral vascular disease, unspecified: Secondary | ICD-10-CM | POA: Diagnosis not present

## 2022-08-30 DIAGNOSIS — E1122 Type 2 diabetes mellitus with diabetic chronic kidney disease: Secondary | ICD-10-CM | POA: Insufficient documentation

## 2022-08-31 NOTE — Progress Notes (Signed)
DESMIN, DALEO ([id number]) [id number] Page 1 of 6 Visit Report for 08/30/2022 Allergy List Details Patient Name: Date of Service: Randy [name], Randy [name] 08/30/2022 8:00 A M Medical Record Number: [id number] Patient Account Number: [id number] Date of Birth/Sex: Treating RN: 04-06-1946 (77 y.o. [name] Primary Care [name]: [name], [name] Other Clinician: Referring Tamyah Cutbirth: Treating [name]/Extender: [name], Deep Weeks in Treatment: 0 Allergies Active Allergies cetirizine Allergy Notes Electronic Signature([name]) Signed: 08/31/2022 12:14:03 PM By: [name] RN Entered By: [name] on 08/29/2022 16:43:36 -------------------------------------------------------------------------------- Arrival Information Details Patient Name: Date of Service: Randy [name] 08/30/2022 8:00 A M Medical Record Number: [id number] Patient Account Number: [id number] Date of Birth/Sex: Treating RN: Dec 03, 1945 (77 y.o. [name], [name] Primary Care [name]: [name], [name] Other Clinician: Referring [name]: Treating [name]/Extender: [name], Deep Weeks in Treatment: 0 Visit Information Patient Arrived: Ambulatory Arrival Time: 08:30 Accompanied By: self Transfer Assistance: None Patient Identification Verified: Yes Secondary Verification Process Completed: Yes Patient Requires Transmission-Based Precautions: No Patient Has Alerts: No Electronic Signature([name]) Signed: 08/31/2022 12:14:03 PM By: [name] RN Entered By: [name] on 08/30/2022 08:30:59 -------------------------------------------------------------------------------- Clinic Level of Care Assessment Details Patient Name: Date of Service: Randy [name], Randy [name] 08/30/2022 8:00 A M Medical Record Number: [id number] Patient Account Number: [id number] Randy [name], Randy [name] ([id number]) [phone].pdf Page 2 of 6 Date of  Birth/Sex: Treating RN: 10-May-1946 (77 y.o. [name], [name] Primary Care Maesyn Frisinger: [name], [name] Other Clinician: Referring [name]: Treating [name]/Extender: [name], Deep Weeks in Treatment: 0 Clinic Level of Care Assessment Items TOOL 4 Quantity Score X- 1 0 Use when only an EandM is performed on FOLLOW-UP visit ASSESSMENTS - Nursing Assessment / Reassessment X- 1 10 Reassessment of Co-morbidities (includes updates in patient status) X- 1 5 Reassessment of Adherence to Treatment Plan ASSESSMENTS - Wound and Skin A ssessment / Reassessment X - Simple Wound Assessment / Reassessment - one wound 1 5 '[]'$  - 0 Complex Wound Assessment / Reassessment - multiple wounds '[]'$  - 0 Dermatologic / Skin Assessment (not related to wound area) ASSESSMENTS - Focused Assessment X- 1 5 Circumferential Edema Measurements - multi extremities '[]'$  - 0 Nutritional Assessment / Counseling / Intervention '[]'$  - 0 Lower Extremity Assessment (monofilament, tuning fork, pulses) '[]'$  - 0 Peripheral Arterial Disease Assessment (using hand held doppler) ASSESSMENTS - Ostomy and/or Continence Assessment and Care '[]'$  - 0 Incontinence Assessment and Management '[]'$  - 0 Ostomy Care Assessment and Management (repouching, etc.) PROCESS - Coordination of Care X - Simple Patient / Family Education for ongoing care 1 15 '[]'$  - 0 Complex (extensive) Patient / Family Education for ongoing care X- 1 10 Staff obtains Programmer, systems, Records, T Results / Process Orders est '[]'$  - 0 Staff telephones HHA, Nursing Homes / Clarify orders / etc '[]'$  - 0 Routine Transfer to another Facility (non-emergent condition) '[]'$  - 0 Routine Hospital Admission (non-emergent condition) X- 1 15 New Admissions / Biomedical engineer / Ordering NPWT Apligraf, etc. , '[]'$  - 0 Emergency Hospital Admission (emergent condition) X- 1 10 Simple Discharge Coordination '[]'$  - 0 Complex (extensive) Discharge Coordination PROCESS  - Special Needs '[]'$  - 0 Pediatric / Minor Patient Management '[]'$  - 0 Isolation Patient Management '[]'$  - 0 Hearing / Language / Visual special needs '[]'$  - 0 Assessment of Community assistance (transportation, D/C planning, etc.) '[]'$  - 0 Additional assistance / Altered mentation '[]'$  - 0 Support Surface([name]) Assessment (bed, cushion, seat, etc.) INTERVENTIONS - Wound Cleansing / Measurement '[]'$  - 0 Simple  Wound Cleansing - one wound '[]'$  - 0 Complex Wound Cleansing - multiple wounds '[]'$  - 0 Wound Imaging (photographs - any number of wounds) '[]'$  - 0 Wound Tracing (instead of photographs) '[]'$  - 0 Simple Wound Measurement - one wound '[]'$  - 0 Complex Wound Measurement - multiple wounds Hiley, Nathaneil Canary ([id number]) [id number] Page 3 of 6 INTERVENTIONS - Wound Dressings '[]'$  - 0 Small Wound Dressing one or multiple wounds '[]'$  - 0 Medium Wound Dressing one or multiple wounds '[]'$  - 0 Large Wound Dressing one or multiple wounds '[]'$  - 0 Application of Medications - topical '[]'$  - 0 Application of Medications - injection INTERVENTIONS - Miscellaneous '[]'$  - 0 External ear exam '[]'$  - 0 Specimen Collection (cultures, biopsies, blood, body fluids, etc.) '[]'$  - 0 Specimen([name]) / Culture([name]) sent or taken to Lab for analysis '[]'$  - 0 Patient Transfer (multiple staff / Harrel Lemon Lift / Similar devices) '[]'$  - 0 Simple Staple / Suture removal (25 or less) '[]'$  - 0 Complex Staple / Suture removal (26 or more) '[]'$  - 0 Hypo / Hyperglycemic Management (close monitor of Blood Glucose) X- 1 15 Ankle / Brachial Index (ABI) - do not check if billed separately X- 1 5 Vital Signs Has the patient been seen at the hospital within the last three years: Yes Total Score: 95 Level Of Care: New/Established - Level 3 Electronic Signature([name]) Signed: 08/31/2022 12:14:03 PM By: [name] RN Entered By: [name] on 08/30/2022  08:59:07 -------------------------------------------------------------------------------- Encounter Discharge Information Details Patient Name: Date of Service: Randy [name] 08/30/2022 8:00 A M Medical Record Number: [id number] Patient Account Number: [id number] Date of Birth/Sex: Treating RN: July 11, 1946 (77 y.o. [name] Primary Care Seanpaul Preece: [name], [name] Other Clinician: Referring Samil Mecham: Treating Yama Nielson/Extender: [name], Deep Weeks in Treatment: 0 Encounter Discharge Information Items Discharge Condition: Stable Ambulatory Status: Ambulatory Discharge Destination: Home Transportation: Private Auto Accompanied By: self Schedule Follow-up Appointment: Yes Clinical Summary of Care: Patient Declined Electronic Signature([name]) Signed: 08/31/2022 12:14:03 PM By: [name] RN Entered By: [name] on 08/30/2022 08:59:32 Randy [name], Randy [name] ([id number]) [id number] Page 4 of 6 -------------------------------------------------------------------------------- Lower Extremity Assessment Details Patient Name: Date of Service: Randy [name], Randy [name] 08/30/2022 8:00 A M Medical Record Number: [id number] Patient Account Number: [id number] Date of Birth/Sex: Treating RN: 05/14/1946 (78 y.o. [name], [name] Primary Care Geneve Kimpel: [name], [name] Other Clinician: Referring Jalesha Plotz: Treating Beauty Pless/Extender: [name], Deep Weeks in Treatment: 0 Edema Assessment Assessed: [Left: No] [Right: No] [Left: Edema] [Right: :] Calf Left: Right: Point of Measurement: 29 cm From Medial Instep 34 cm Ankle Left: Right: Point of Measurement: 9 cm From Medial Instep 24 cm Knee To Floor Left: Right: From Medial Instep 44 cm Vascular Assessment Pulses: Dorsalis Pedis Palpable: [Left:Yes] Posterior Tibial Palpable: [Left:Yes] Blood Pressure: Brachial: [Left:121] Ankle: [Left:Dorsalis Pedis: 110  0.91] Electronic Signature([name]) Signed: 08/31/2022 12:14:03 PM By: [name] RN Entered By: [name] on 08/30/2022 08:33:18 -------------------------------------------------------------------------------- Multi Wound Chart Details Patient Name: Date of Service: Randy [name] 08/30/2022 8:00 A M Medical Record Number: [id number] Patient Account Number: [id number] Date of Birth/Sex: Treating RN: 12-21-1945 (77 y.o. M) Primary Care Bre Pecina: [name], [name] Other Clinician: Referring Margurite Duffy: Treating Tarig Zimmers/Extender: [name], Deep Weeks in Treatment: 0 Vital Signs Height(in): 68 Pulse(bpm): 97 Weight(lbs): 160 Blood Pressure(mmHg): 121/77 Body Mass Index(BMI): 24.3 Temperature(F): 98.4 Respiratory Rate(breaths/min): 20 [Treatment Notes:Wound Assessments Treatment Notes] Electronic Signature([name]) Randy [name], Randy [name] ([id number]) [id number] Page 5 of 6 Signed: 08/30/2022 9:29:55 AM By: Heber [place],  Janett Billow DO Entered By: Kalman Shan on 08/30/2022 09:16:30 -------------------------------------------------------------------------------- Multi-Disciplinary Care Plan Details Patient Name: Date of Service: Randy [name], Randy [name] 08/30/2022 8:00 A M Medical Record Number: [id number] Patient Account Number: [id number] Date of Birth/Sex: Treating RN: 03-01-1946 (77 y.o. [name] Primary Care Enyla Lisbon: [name], [name] Other Clinician: Referring Kaleen Rochette: Treating Reana Chacko/Extender: [name], Deep Weeks in Treatment: 0 Active Inactive Electronic Signature([name]) Signed: 08/31/2022 12:14:03 PM By: [name] RN Entered By: [name] on 08/30/2022 08:58:29 -------------------------------------------------------------------------------- Pain Assessment Details Patient Name: Date of Service: Randy [name] 08/30/2022 8:00 A M Medical Record Number: [id number] Patient Account Number: [id number] Date  of Birth/Sex: Treating RN: [birth year] (77 y.o. [name], [name] Primary Care Nirvana Blanchett: [name], [name] Other Clinician: Referring Daiveon Markman: Treating Mychael Soots/Extender: [name], Deep Weeks in Treatment: 0 Active Problems Location of Pain Severity and Description of Pain Patient Has Paino No Site Locations Pain Management and Medication Current Pain Management: Electronic Signature([name]) Signed: 08/31/2022 12:14:03 PM By: [name] RN Randy [name], Randy [name] ([id number]) By: [name] RN [id number] Page 6 of 6 Signed: 08/31/2022 12:14:03 PM Entered By: [name] on 08/30/2022 08:30:39 -------------------------------------------------------------------------------- Patient/Caregiver Education Details Patient Name: Date of Service: Randy [name] 1/4/2024andnbsp8:00 A M Medical Record Number: [id number] Patient Account Number: [id number] Date of Birth/Gender: Treating RN: 01/19/1946 (77 y.o. [name] Primary Care Physician: [name], [name] Other Clinician: Referring Physician: Treating Physician/Extender: [name], Deep Weeks in Treatment: 0 Education Assessment Education Provided To: Patient Education Topics Provided Wound/Skin Impairment: Methods: Explain/Verbal Responses: Reinforcements needed, State content correctly Electronic Signature([name]) Signed: 08/31/2022 12:14:03 PM By: [name] RN Entered By: [name] on 08/30/2022 08:55:57 -------------------------------------------------------------------------------- Vitals Details Patient Name: Date of Service: Randy [name] 08/30/2022 8:00 A M Medical Record Number: [id number] Patient Account Number: [id number] Date of Birth/Sex: Treating RN: [birth year] (77 y.o. [name], [name] Primary Care Nox Talent: [name], [name] Other Clinician: Referring Linda Grimmer: Treating Emilyn Ruble/Extender: [name], Deep Weeks  in Treatment: 0 Vital Signs Time Taken: 08:30 Temperature (F): 98.4 Height (in): 68 Pulse (bpm): 97 Source: Stated Respiratory Rate (breaths/min): 20 Weight (lbs): 160 Blood Pressure (mmHg): 121/77 Source: Stated Reference Range: 80 - 120 mg / dl Body Mass Index (BMI): 24.3 Electronic Signature([name]) Signed: 08/31/2022 12:14:03 PM By: [name] RN Entered By: [name] on 08/30/2022 08:33:27

## 2022-08-31 NOTE — Progress Notes (Signed)
DEMITRI, KUCINSKI ([id number]) [id number] Page 1 of 7 Visit Report for 08/30/2022 Chief Complaint Document Details Patient Name: Date of Service: Randy [name], Randy [name] 08/30/2022 8:00 A M Medical Record Number: [id number] Patient Account Number: [id number] Date of Birth/Sex: Treating RN: [birth year] (76 y.o. M) Primary Care Provider: [name], [name] Other Clinician: Referring Provider: Treating Provider/Extender: [name], Deep Weeks in Treatment: 0 Information Obtained from: Patient Chief Complaint 08/30/2022; history of wound to the left dorsal foot Electronic Signature([name]) Signed: 08/30/2022 9:29:55 AM By: Kalman Shan DO Entered By: Kalman Shan on 08/30/2022 09:08:36 -------------------------------------------------------------------------------- HPI Details Patient Name: Date of Service: Randy [name] 08/30/2022 8:00 A M Medical Record Number: [id number] Patient Account Number: [id number] Date of Birth/Sex: Treating RN: [birth year] (77 y.o. M) Primary Care Provider: [name], [name] Other Clinician: Referring Provider: Treating Provider/Extender: [name], Deep Weeks in Treatment: 0 History of Present Illness HPI Description: 08/30/2022 Randy [name] is a 77 year old male with a past medical history of insulin-dependent controlled type 2 diabetes, chronic venous insufficiency, prostate cancer and peripheral arterial disease that presents the clinic to discuss his history of wound to the left dorsal foot. He developed this 5 months ago and the wound eventually healed. He states it started out as a blister from too much swelling in his foot. He wears compression stockings daily and has not had a reoccurring wound or issues to this area. He follows with vein and vascular And had an arteriogram of the left lower extremity On 07/04/2022. He has chronic left SFA occlusion that was not successfully revascularized. He has  follow-up with them this month. His ABI in our office was 0.9. T oday he presents with no open wounds. Electronic Signature([name]) Signed: 08/30/2022 9:29:55 AM By: Kalman Shan DO Entered By: Kalman Shan on 08/30/2022 09:20:36 -------------------------------------------------------------------------------- Physical Exam Details Patient Name: Date of Service: Randy [name] 08/30/2022 8:00 A M Medical Record Number: [id number] Patient Account Number: [id number] Date of Birth/Sex: Treating RN: June 18, 1946 (77 y.o. M) Primary Care Provider: [name], [name] Other Clinician: Theressa Stamps ([id number]) [id number] Page 2 of 7 Referring Provider: Treating Provider/Extender: [name], Deep Weeks in Treatment: 0 Constitutional respirations regular, non-labored and within target range for patient.. Cardiovascular 2+ dorsalis pedis/posterior tibialis pulses. Psychiatric pleasant and cooperative. Notes Left lower extremity: Epithelization to a previous wound site on the dorsal left foot. No open wounds to the lower extremity. 2+ pitting edema to the knee. Electronic Signature([name]) Signed: 08/30/2022 9:29:55 AM By: Kalman Shan DO Entered By: Kalman Shan on 08/30/2022 09:21:44 -------------------------------------------------------------------------------- Physician Orders Details Patient Name: Date of Service: Randy [name] 08/30/2022 8:00 A M Medical Record Number: [id number] Patient Account Number: [id number] Date of Birth/Sex: Treating RN: 1946/03/10 (77 y.o. [name] Primary Care Provider: [name], [name] Other Clinician: Referring Provider: Treating Provider/Extender: [name], Deep Weeks in Treatment: 0 Verbal / Phone Orders: No Diagnosis Coding Follow-up Appointments Other: - NO need to follow up with [place]. Follow up with your vein and vascular about your pain in your lower  extremities. Discharge From [facility] Discharge from [place] Signature([name]) Signed: 08/30/2022 9:29:55 AM By: Kalman Shan DO Entered By: Kalman Shan on 08/30/2022 09:21:50 -------------------------------------------------------------------------------- Problem List Details Patient Name: Date of Service: Randy [name] 08/30/2022 8:00 A M Medical Record Number: [id number] Patient Account Number: [id number] Date of Birth/Sex: Treating RN: 1945-11-07 (77 y.o. M) Primary Care Provider: [name], [name] Other Clinician: Referring Provider: Treating Provider/Extender: Heber Roy,  Dorena Dew, Deep Weeks in Treatment: 0 Active Problems ICD-10 Encounter Code Description Active Date MDM Diagnosis E11.40 Type 2 diabetes mellitus with diabetic neuropathy, unspecified 08/30/2022 No Yes Lorman, Winton ([id number]) [id number] Page 3 of 7 I73.9 Peripheral vascular disease, unspecified 08/30/2022 No Yes C61 Malignant neoplasm of prostate 08/30/2022 No Yes I10 Essential (primary) hypertension 08/30/2022 No Yes Inactive Problems Resolved Problems Electronic Signature([name]) Signed: 08/30/2022 9:29:55 AM By: Kalman Shan DO Entered By: Kalman Shan on 08/30/2022 09:16:27 -------------------------------------------------------------------------------- Progress Note Details Patient Name: Date of Service: Randy [name] 08/30/2022 8:00 A M Medical Record Number: [id number] Patient Account Number: [id number] Date of Birth/Sex: Treating RN: [birth year] (77 y.o. M) Primary Care Provider: [name], [name] Other Clinician: Referring Provider: Treating Provider/Extender: [name], Deep Weeks in Treatment: 0 Subjective Chief Complaint Information obtained from Patient 08/30/2022; history of wound to the left dorsal foot History of Present Illness (HPI) 08/30/2022 Randy [name] is a 77 year old male with a past medical  history of insulin-dependent controlled type 2 diabetes, chronic venous insufficiency, prostate cancer and peripheral arterial disease that presents the clinic to discuss his history of wound to the left dorsal foot. He developed this 5 months ago and the wound eventually healed. He states it started out as a blister from too much swelling in his foot. He wears compression stockings daily and has not had a reoccurring wound or issues to this area. He follows with vein and vascular And had an arteriogram of the left lower extremity On 07/04/2022. He has chronic left SFA occlusion that was not successfully revascularized. He has follow-up with them this month. His ABI in our office was 0.9. T oday he presents with no open wounds. Patient History Information obtained from Patient, Chart. Allergies cetirizine Family History Unknown History. Social History Current every day smoker - 3 ciigs, Marital Status - Separated, Alcohol Use - Never, Drug Use - No History, Caffeine Use - Rarely. Medical History Eyes Patient has history of Cataracts Respiratory Patient has history of Sleep Apnea - CPAP Cardiovascular Patient has history of Hypertension Gastrointestinal Patient has history of Hepatitis A, Hepatitis B, Hepatitis C Endocrine Randy [name], Randy [name] ([id number]) [id number] Page 4 of 7 Patient has history of Type II Diabetes Musculoskeletal Patient has history of Osteoarthritis - hips Neurologic Patient has history of Neuropathy Patient is treated with Insulin. Hospitalization/Surgery History - lumbar laminectomy. - lap radical prostatectomy. - colonoscopy. - cervical foramenectomy. - penile prosthesis placement. Medical A Surgical History Notes nd Cardiovascular Hyperlipidemia Gastrointestinal GERD, HEPATITIS Genitourinary CKD stage 3 Review of Systems (ROS) Ear/Nose/Mouth/Throat Denies complaints or symptoms of Chronic sinus problems or  rhinitis. Gastrointestinal Denies complaints or symptoms of Frequent diarrhea, Nausea, Vomiting. Genitourinary Denies complaints or symptoms of Frequent urination. Integumentary (Skin) Complains or has symptoms of Wounds. Psychiatric Denies complaints or symptoms of Claustrophobia. Objective Constitutional respirations regular, non-labored and within target range for patient.. Vitals Time Taken: 8:30 AM, Height: 68 in, Source: Stated, Weight: 160 lbs, Source: Stated, BMI: 24.3, Temperature: 98.4 F, Pulse: 97 bpm, Respiratory Rate: 20 breaths/min, Blood Pressure: 121/77 mmHg. Cardiovascular 2+ dorsalis pedis/posterior tibialis pulses. Psychiatric pleasant and cooperative. General Notes: Left lower extremity: Epithelization to a previous wound site on the dorsal left foot. No open wounds to the lower extremity. 2+ pitting edema to the knee. Assessment Active Problems ICD-10 Type 2 diabetes mellitus with diabetic neuropathy, unspecified Peripheral vascular disease, unspecified Malignant neoplasm of prostate Essential (primary) hypertension Patient has a history of wounds to his left foot. This appears to  be in the setting of chronic venous insufficiency and complicated by PAD. Fortunately he healed a previous wound and has not had recurrence or issues with open wounds to his left lower extremity. I did recommend He continue to wear hid compression stockings daily as he has 2+ pitting edema on exam. I recommended he keep his appointment for follow-up with vein and vascular. He may follow-up as needed. 46 minutes was spent on the encounter including face-to-face, EMR review and coordination of care Plan Follow-up Appointments: Other: - NO need to follow up with [place]. Follow up with your vein and vascular about your pain in your lower extremities. Discharge From [facility]: Randy [name], Randy [name] ([id number]) [id number] Page 5 of 7 Discharge from [place] 1. Follow-up as needed 2. Compression stockings daily Electronic Signature([name]) Signed: 08/30/2022 9:29:55 AM By: Kalman Shan DO Entered By: Kalman Shan on 08/30/2022 09:24:33 -------------------------------------------------------------------------------- HxROS Details Patient Name: Date of Service: Randy [name] 08/30/2022 8:00 A M Medical Record Number: [id number] Patient Account Number: [id number] Date of Birth/Sex: Treating RN: Sep 21, 1945 (77 y.o. [name], [name] Primary Care Provider: [name], [name] Other Clinician: Referring Provider: Treating Provider/Extender: [name], Deep Weeks in Treatment: 0 Information Obtained From Patient Chart Ear/Nose/Mouth/Throat Complaints and Symptoms: Negative for: Chronic sinus problems or rhinitis Gastrointestinal Complaints and Symptoms: Negative for: Frequent diarrhea; Nausea; Vomiting Medical History: Positive for: Hepatitis A; Hepatitis B; Hepatitis C Past Medical History Notes: GERD, HEPATITIS Genitourinary Complaints and Symptoms: Negative for: Frequent urination Medical History: Past Medical History Notes: CKD stage 3 Integumentary (Skin) Complaints and Symptoms: Positive for: Wounds Psychiatric Complaints and Symptoms: Negative for: Claustrophobia Eyes Medical History: Positive for: Cataracts Hematologic/Lymphatic Respiratory Medical History: Positive for: Sleep Apnea - CPAP Randy [name], Randy [name] ([id number]) [id number] Page 6 of 7 Cardiovascular Medical History: Positive for: Hypertension Past Medical History Notes: Hyperlipidemia Endocrine Medical History: Positive for: Type II Diabetes Treated with: Insulin Immunological Musculoskeletal Medical History: Positive for: Osteoarthritis - hips Neurologic Medical History: Positive for: Neuropathy Oncologic HBO Extended History Items Eyes: Cataracts Immunizations Pneumococcal  Vaccine: Received Pneumococcal Vaccination: Yes Received Pneumococcal Vaccination On or After 60th Birthday: Yes Implantable Devices No devices added Hospitalization / Surgery History Type of Hospitalization/Surgery lumbar laminectomy lap radical prostatectomy colonoscopy cervical foramenectomy penile prosthesis placement Family and Social History Unknown History: Yes; Current every day smoker - 3 ciigs; Marital Status - Separated; Alcohol Use: Never; Drug Use: No History; Caffeine Use: Rarely; Financial Concerns: No; Food, Clothing or Shelter Needs: No; Support System Lacking: No; Transportation Concerns: No Electronic Signature([name]) Signed: 08/30/2022 9:29:55 AM By: Kalman Shan DO Signed: 08/31/2022 12:14:03 PM By: [name] RN Entered By: [name] on 08/29/2022 16:49:24 -------------------------------------------------------------------------------- SuperBill Details Patient Name: Date of Service: Randy [name] 08/30/2022 Medical Record Number: [id number] Patient Account Number: [id number] Date of Birth/Sex: Treating RN: [birth year] (77 y.o. [name], [name] Primary Care Provider: [name], [name] Other Clinician: Referring Provider: Treating Provider/Extender: [name], Deep Weeks in Treatment: [place], Randy [name] ([id number]) [id number] Page 7 of 7 Diagnosis Coding ICD-10 Codes Code Description E11.40 Type 2 diabetes mellitus with diabetic neuropathy, unspecified I73.9 Peripheral vascular disease, unspecified C61 Malignant neoplasm of prostate I10 Essential (primary) hypertension Facility Procedures : CPT4 Code: [id number] Description: 99213 - WOUND CARE VISIT-LEV 3 EST PT Modifier: Quantity: 1 Physician Procedures : CPT4 Code Description Modifier [id number] - WC PHYS LEVEL 4 - NEW PT ICD-10 Diagnosis Description E11.40 Type 2 diabetes mellitus with diabetic neuropathy, unspecified I73.9 Peripheral  vascular disease,  unspecified C61 Malignant neoplasm of prostate  I10 Essential (primary) hypertension Quantity: 1 Electronic Signature([name]) Signed: 08/30/2022 9:29:55 AM By: Kalman Shan DO Entered By: Kalman Shan on 08/30/2022 09:24:39

## 2022-08-31 NOTE — Progress Notes (Signed)
SAVIEN, MAMULA ([id number]) [id number] Page 1 of 4 Visit Report for 08/30/2022 Abuse Risk Screen Details Patient Name: Date of Service: Randy [name], Randy [name] 08/30/2022 8:00 A M Medical Record Number: [id number] Patient Account Number: [id number] Date of Birth/Sex: Treating RN: July 12, 1946 (77 y.o. [name], [name] Primary Care Danity Schmelzer: [name], [name] Other Clinician: Referring Rowyn Mustapha: Treating Jrake Rodriquez/Extender: [name], Deep Weeks in Treatment: 0 Abuse Risk Screen Items Answer ABUSE RISK SCREEN: Has anyone close to you tried to hurt or harm you recentlyo No Do you feel uncomfortable with anyone in your familyo No Has anyone forced you do things that you didnt want to doo No Electronic Signature([name]) Signed: 08/31/2022 12:14:03 PM By: [name] RN Entered By: [name] on 08/30/2022 08:29:21 -------------------------------------------------------------------------------- Activities of Daily Living Details Patient Name: Date of Service: Randy [name], Randy [name] 08/30/2022 8:00 A M Medical Record Number: [id number] Patient Account Number: [id number] Date of Birth/Sex: Treating RN: [birth year] (77 y.o. [name] Primary Care Madysin Crisp: [name], [name] Other Clinician: Referring Ardella Chhim: Treating Malaki Koury/Extender: [name], Deep Weeks in Treatment: 0 Activities of Daily Living Items Answer Activities of Daily Living (Please select one for each item) Drive Automobile Completely Able T Medications ake Completely Able Use T elephone Completely Able Care for Appearance Completely Able Use T oilet Completely Able Bath / Shower Completely Able Dress Self Completely Able Feed Self Completely Able Walk Completely Able Get In / Out Bed Completely Able Housework Completely Able Prepare Meals Completely Able Handle Money Completely Able Shop for Self Completely Able Electronic Signature([name]) Signed:  08/31/2022 12:14:03 PM By: [name] RN Entered By: [name] on 08/30/2022 08:29:39 Helget, [place] ([id number]) [id number] Page 2 of 4 -------------------------------------------------------------------------------- Education Screening Details Patient Name: Date of Service: Randy [name], Randy [name] 08/30/2022 8:00 A M Medical Record Number: [id number] Patient Account Number: [id number] Date of Birth/Sex: Treating RN: 1946/01/29 (77 y.o. [name], [name] Primary Care Terrell Ostrand: [name], [name] Other Clinician: Referring Jontavius Rabalais: Treating Cheveyo [place]/Extender: [name], Deep Weeks in Treatment: 0 Primary Learner Assessed: Patient Learning Preferences/Education Level/Primary Language Learning Preference: Explanation, Demonstration, Communication Board, Printed Material Highest Education Level: High School Preferred Language: English Cognitive Barrier Language Barrier: No Translator Needed: No Memory Deficit: No Emotional Barrier: No Cultural/Religious Beliefs Affecting Medical Care: No Physical Barrier Impaired Vision: No Impaired Hearing: No Decreased Hand dexterity: No Motivation Anxiety Level: Calm Cooperation: Cooperative Education Importance: Denies Need Interest in Health Problems: Asks Questions Perception: Coherent Willingness to Engage in Self-Management High Activities: Readiness to Engage in Self-Management High Activities: Electronic Signature([name]) Signed: 08/31/2022 12:14:03 PM By: [name] RN Entered By: [name] on 08/30/2022 08:29:58 -------------------------------------------------------------------------------- Fall Risk Assessment Details Patient Name: Date of Service: Randy [name] 08/30/2022 8:00 A M Medical Record Number: [id number] Patient Account Number: [id number] Date of Birth/Sex: Treating RN: [birth year] (76 y.o. [name], [name] Primary Care Lubertha Leite: [name],  [name] Other Clinician: Referring Tziporah Knoke: Treating Rhydian Baldi/Extender: [name], Deep Weeks in Treatment: 0 Fall Risk Assessment Items Have you had 2 or more falls in the last 12 monthso 0 No Have you had any fall that resulted in injury in the last 12 monthso 0 No FALLS RISK SCREEN History of falling - immediate or within 3 months 0 No Secondary diagnosis (Do you have 2 or more medical diagnoseso) 0 No Ambulatory aid None/bed rest/wheelchair/nurse 0 No Crutches/cane/walker 0 No Ayad, Fuller ([id number]) [id number] Page 3 of 4 Furniture 0 No Intravenous therapy Access/Saline/Heparin Lock 0 No  Gait/Transferring Normal/ bed rest/ wheelchair 0 No Weak (short steps with or without shuffle, stooped but able to lift head while walking, may seek 0 No support from furniture) Impaired (short steps with shuffle, may have difficulty arising from chair, head down, impaired 0 No balance) Mental Status Oriented to own ability 0 No Electronic Signature([name]) Signed: 08/31/2022 12:14:03 PM By: [name] RN Entered By: [name] on 08/30/2022 08:30:16 -------------------------------------------------------------------------------- Foot Assessment Details Patient Name: Date of Service: Randy [name] 08/30/2022 8:00 A M Medical Record Number: [id number] Patient Account Number: [id number] Date of Birth/Sex: Treating RN: [birth year] (77 y.o. [name] Primary Care Monterio Bob: [name], [name] Other Clinician: Referring Sophiana Milanese: Treating Ridley Schewe/Extender: [name], Deep Weeks in Treatment: 0 Foot Assessment Items Site Locations + = Sensation present, - = Sensation absent, C = Callus, U = Ulcer R = Redness, W = Warmth, M = Maceration, PU = Pre-ulcerative lesion F = Fissure, [name] = Swelling, D = Dryness Assessment Right: Left: Other Deformity: No No Prior Foot Ulcer: No No Prior Amputation: No No Charcot  Joint: No No Ambulatory Status: Ambulatory Without Help Gait: Steady Electronic Signature([name]) Signed: 08/31/2022 12:14:03 PM By: [name] RN Entered By: [name] on 08/30/2022 08:55:30 Gradel, Maan ([id number]) [id number] Page 4 of 4 -------------------------------------------------------------------------------- Nutrition Risk Screening Details Patient Name: Date of Service: Randy [name], Randy [name] 08/30/2022 8:00 A M Medical Record Number: [id number] Patient Account Number: [id number] Date of Birth/Sex: Treating RN: [birth year] (77 y.o. [name] Primary Care Verena Shawgo: [name], [name] Other Clinician: Referring Jaylend Reiland: Treating Samone Guhl/Extender: [name], Deep Weeks in Treatment: 0 Height (in): Weight (lbs): Body Mass Index (BMI): Nutrition Risk Screening Items Score Screening NUTRITION RISK SCREEN: I have an illness or condition that made me change the kind and/or amount of food I eat 0 No I eat fewer than two meals per day 0 No I eat few fruits and vegetables, or milk products 0 No I have three or more drinks of beer, liquor or wine almost every day 0 No I have tooth or mouth problems that make it hard for me to eat 0 No I don't always have enough money to buy the food I need 0 No I eat alone most of the time 0 No I take three or more different prescribed or over-the-counter drugs a day 0 No Without wanting to, I have lost or gained 10 pounds in the last six months 0 No I am not always physically able to shop, cook and/or feed myself 0 No Nutrition Protocols Good Risk Protocol 0 No interventions needed Moderate Risk Protocol High Risk Proctocol Risk Level: Good Risk Score: 0 Electronic Signature([name]) Signed: 08/31/2022 12:14:03 PM By: [name] RN Entered By: [name] on 08/30/2022 08:30:24

## 2022-09-12 DIAGNOSIS — G4733 Obstructive sleep apnea (adult) (pediatric): Secondary | ICD-10-CM | POA: Diagnosis not present

## 2022-09-21 DIAGNOSIS — H43812 Vitreous degeneration, left eye: Secondary | ICD-10-CM | POA: Diagnosis not present

## 2022-09-28 DIAGNOSIS — E109 Type 1 diabetes mellitus without complications: Secondary | ICD-10-CM | POA: Diagnosis not present

## 2022-10-13 DIAGNOSIS — G4733 Obstructive sleep apnea (adult) (pediatric): Secondary | ICD-10-CM | POA: Diagnosis not present

## 2022-10-28 DIAGNOSIS — E109 Type 1 diabetes mellitus without complications: Secondary | ICD-10-CM | POA: Diagnosis not present

## 2022-11-11 DIAGNOSIS — G4733 Obstructive sleep apnea (adult) (pediatric): Secondary | ICD-10-CM | POA: Diagnosis not present

## 2022-11-27 DIAGNOSIS — E109 Type 1 diabetes mellitus without complications: Secondary | ICD-10-CM | POA: Diagnosis not present

## 2022-12-12 DIAGNOSIS — G4733 Obstructive sleep apnea (adult) (pediatric): Secondary | ICD-10-CM | POA: Diagnosis not present

## 2022-12-24 DIAGNOSIS — L97522 Non-pressure chronic ulcer of other part of left foot with fat layer exposed: Secondary | ICD-10-CM | POA: Diagnosis not present

## 2022-12-24 DIAGNOSIS — I8310 Varicose veins of unspecified lower extremity with inflammation: Secondary | ICD-10-CM | POA: Diagnosis not present

## 2023-01-02 DIAGNOSIS — I8310 Varicose veins of unspecified lower extremity with inflammation: Secondary | ICD-10-CM | POA: Diagnosis not present

## 2023-01-08 DIAGNOSIS — I8310 Varicose veins of unspecified lower extremity with inflammation: Secondary | ICD-10-CM | POA: Diagnosis not present

## 2023-01-09 DIAGNOSIS — I872 Venous insufficiency (chronic) (peripheral): Secondary | ICD-10-CM | POA: Diagnosis not present

## 2023-01-09 DIAGNOSIS — L97422 Non-pressure chronic ulcer of left heel and midfoot with fat layer exposed: Secondary | ICD-10-CM | POA: Diagnosis not present

## 2023-01-11 DIAGNOSIS — G4733 Obstructive sleep apnea (adult) (pediatric): Secondary | ICD-10-CM | POA: Diagnosis not present

## 2023-01-16 DIAGNOSIS — I872 Venous insufficiency (chronic) (peripheral): Secondary | ICD-10-CM | POA: Diagnosis not present

## 2023-01-16 DIAGNOSIS — L97422 Non-pressure chronic ulcer of left heel and midfoot with fat layer exposed: Secondary | ICD-10-CM | POA: Diagnosis not present

## 2023-01-17 ENCOUNTER — Encounter: Payer: Self-pay | Admitting: Orthopaedic Surgery

## 2023-01-17 ENCOUNTER — Other Ambulatory Visit: Payer: Self-pay

## 2023-01-17 ENCOUNTER — Ambulatory Visit: Payer: No Typology Code available for payment source | Admitting: Orthopaedic Surgery

## 2023-01-17 VITALS — Ht 68.0 in | Wt 160.0 lb

## 2023-01-17 DIAGNOSIS — M545 Low back pain, unspecified: Secondary | ICD-10-CM | POA: Diagnosis not present

## 2023-01-17 DIAGNOSIS — G8929 Other chronic pain: Secondary | ICD-10-CM

## 2023-01-22 NOTE — Progress Notes (Signed)
Office Visit Note   Patient: Randy [name]           Date of Birth: [birth year]           MRN: [id number] Visit Date: 01/17/2023              Requested by: Margorie John, MD [address] [place],  [place] [address] PCP: Margorie John, MD   Assessment & Plan: Visit Diagnoses:  1. Chronic left-sided low back pain, unspecified whether sciatica present     Plan: Patient needs lumbar MRI post L2-3 decompression bilateral microdiscectomy.  L4-5 disc herniation with left greater than right symptoms comparison to 2021 MRI scan.  Office follow-up after scan for review.  Follow-Up Instructions: No follow-ups on file.   Orders:  Orders Placed This Encounter  Procedures   XR Lumbar Spine 2-3 Views   MR Lumbar Spine w/o contrast   No orders of the defined types were placed in this encounter.     Procedures: No procedures performed   Clinical Data: No additional findings.   Subjective: Chief Complaint  Patient presents with   Lower Back - Pain    HPI 77 year old male have not seen since 2022 normally followed at the [place] in [place] returns with worsening low back pain running down his left leg with weakness in his left leg.  Previous complete laminectomy L2 in February 2019 by me with bilateral microdiscectomy at L2-3 level.  He states he had done well until the last several months.  He has tried Tylenol without relief left leg feels weak.  MRI scan done 2 years after surgery 2021 showed new large disc protrusion with cranial migration L4-5 with moderate stenosis.  Patient not having increased pain that radiates down his left leg to the dorsum of the foot and leg weakness.  Review of Systems no chills or fever all other systems noncontributory to HPI.  History of prostate cancer.  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  Positive diabetes on insulin history of gout on 100 mg allopurinol.  Stage III kidney disease with 07/04/2022 creatinine  2.26.   Objective: Vital Signs: Ht 5\' 8"  (1.727 m)   Wt 160 lb (72.6 kg)   BMI 24.33 kg/m   Physical Exam Constitutional:      Appearance: He is well-developed.  HENT:     Head: Normocephalic and atraumatic.     Right Ear: External ear normal.     Left Ear: External ear normal.  Eyes:     Pupils: Pupils are equal, round, and reactive to light.  Neck:     Thyroid: No thyromegaly.     Trachea: No tracheal deviation.  Cardiovascular:     Rate and Rhythm: Normal rate.  Pulmonary:     Effort: Pulmonary effort is normal.     Breath sounds: No wheezing.  Abdominal:     General: Bowel sounds are normal.     Palpations: Abdomen is soft.  Musculoskeletal:     Cervical back: Neck supple.  Skin:    General: Skin is warm and dry.     Capillary Refill: Capillary refill takes less than 2 seconds.  Neurological:     Mental Status: He is alert and oriented to person, place, and time.  Psychiatric:        Behavior: Behavior normal.        Thought Content: Thought content normal.        Judgment: Judgment normal.     Ortho Exam healed  lumbar incision.  Sciatic notch tenderness present more on the left than right pain with straight leg raising 80 degrees right and left.  He is able to ambulate without ankle dorsiflexion weakness.  Well-healed cervical incision posteriorly.  Clawhand deformity on the right he relates that after a posterior surgery at the [facility].  No incision at the elbow.  No discomfort with internal/external rotation of the hip.  Specialty Comments:  No specialty comments available.  Imaging: No results found.   PMFS History: Patient Active Problem List   Diagnosis Date Noted   Epistaxis 04/05/2021   Lesion of nasal septum 04/05/2021   HNP (herniated nucleus pulposus), lumbar 09/30/2017   Prostate cancer (HCC) 04/11/2016   Cervical spondylosis with myelopathy 03/04/2014   Past Medical History:  Diagnosis Date   Anxiety    HAS  PTSD   BPH (benign  prostatic hypertrophy)    Cancer (HCC)    PROSTATE   Cataracts, bilateral    CKD (chronic kidney disease), stage III (HCC)    Cough    FOR 1 YEAR WITH THICK WHITE PHLEGM   Difficult airway for intubation, subsequent encounter 09/30/2017   DLx1, miller 3, grade 1   Difficult intubation 2015   KEPT OVERNIGHT WITH LAST NECK SURGERY, TOLD TUBE WAS HARDER TO INSERT   ED (erectile dysfunction)    GERD (gastroesophageal reflux disease)    H/O bladder problems    Hepatitis    "yellow jaundice" - 1960's   History of adenomatous polyp of colon    History of condyloma acuminatum    History of syncope    2003-- ? idiology--  per echo ef >55%   Hyperlipidemia    Hypertension    Insulin dependent type 2 diabetes mellitus (HCC)    Lumbar herniated disc    Pulposus   Malfunction of penile prosthesis (HCC)    OA (osteoarthritis)    hips   OSA on CPAP    severe OSA  per study 07-01-2002  (done at [facility])  occasionally uses cpap   Peripheral neuropathy    Pneumonia 2015   PTSD (post-traumatic stress disorder)    PTSD (post-traumatic stress disorder)    Sleep apnea    Spondylolysis of cervical region    Wears glasses     Family History  Problem Relation Age of Onset   Stroke Mother    Stroke Father    Colon cancer Neg Hx    Colon polyps Neg Hx    Crohn's disease Neg Hx    Esophageal cancer Neg Hx    Rectal cancer Neg Hx    Stomach cancer Neg Hx    Ulcerative colitis Neg Hx     Past Surgical History:  Procedure Laterality Date   ANTERIOR CERVICAL DECOMP/DISCECTOMY FUSION N/A 03/04/2014   Procedure: ANTERIOR CERVICAL DECOMPRESSION/DISCECTOMY FUSION 2 LEVELS;  Surgeon: Cristi Loron, MD;  [facility];  Service: Neurosurgery;  Laterality: N/A;  C34 C45 anterior cervical decompression with fusion interbody prosthesis plating and bonegraft   CARPAL TUNNEL RELEASE Right 03/2004   CERVICAL FORAMENECTOMY  Nov 2005//   2000   right  C7 -- T1/   yr 2000 unknown level    COLONOSCOPY     HERNIA REPAIR     LUMBAR LAMINECTOMY/DECOMPRESSION MICRODISCECTOMY N/A 09/30/2017   Procedure: L2-3 LAMINECTOMY WITH MICRODISCECTOMY;  Surgeon: Eldred Manges, MD;  [facility];  Service: Orthopedics;  Laterality: N/A;   NASAL ENDOSCOPY Left 04/05/2021   Procedure: NASAL ENDOSCOPY  With Biopsy;  Surgeon: Laren Boom, DO;  Location: [facility];  Service: ENT;  Laterality: Left;   NASAL HEMORRHAGE CONTROL Left 04/05/2021   Procedure: EPISTAXIS CONTROL;  Surgeon: Laren Boom, DO;  Location: [facility];  Service: ENT;  Laterality: Left;   PENILE PROSTHESIS IMPLANT N/A 03/04/2015   Procedure: REMOVAL AND REPLACMENT PENILE PROTHESIS INFLATABLE COLOPLAST INFRAPUBIC APPROACH;  Surgeon: Ihor Gully, MD;  [facility];  Service: Urology;  Laterality: N/A;   PENILE PROSTHESIS PLACEMENT  1998   REMOVAL CYST FROM CHEEK  08/2014   benign   ROBOT ASSISTED LAPAROSCOPIC RADICAL PROSTATECTOMY N/A 04/11/2016   Procedure: XI ROBOTIC ASSISTED LAPAROSCOPIC RADICAL PROSTATECTOMY;  Surgeon: Crist Fat, MD;  Location: [facility];  Service: Urology;  Laterality: N/A;   Social History   Occupational History   Not on file  Tobacco Use   Smoking status: Every Day    Packs/day: 0.50    Years: 51.00    Additional pack years: 0.00    Total pack years: 25.50    Types: Cigarettes    Passive exposure: Past   Smokeless tobacco: Never  Vaping Use   Vaping Use: Never used  Substance and Sexual Activity   Alcohol use: No    Comment: very  rare   moonshine   Drug use: No   Sexual activity: Not on file

## 2023-01-28 DIAGNOSIS — I739 Peripheral vascular disease, unspecified: Secondary | ICD-10-CM | POA: Diagnosis not present

## 2023-01-28 DIAGNOSIS — M792 Neuralgia and neuritis, unspecified: Secondary | ICD-10-CM | POA: Diagnosis not present

## 2023-01-28 DIAGNOSIS — L97422 Non-pressure chronic ulcer of left heel and midfoot with fat layer exposed: Secondary | ICD-10-CM | POA: Diagnosis not present

## 2023-01-28 DIAGNOSIS — R600 Localized edema: Secondary | ICD-10-CM | POA: Diagnosis not present

## 2023-01-28 DIAGNOSIS — I872 Venous insufficiency (chronic) (peripheral): Secondary | ICD-10-CM | POA: Diagnosis not present

## 2023-01-28 DIAGNOSIS — L97522 Non-pressure chronic ulcer of other part of left foot with fat layer exposed: Secondary | ICD-10-CM | POA: Diagnosis not present

## 2023-01-28 DIAGNOSIS — I83022 Varicose veins of left lower extremity with ulcer of calf: Secondary | ICD-10-CM | POA: Diagnosis not present

## 2023-01-31 ENCOUNTER — Ambulatory Visit (HOSPITAL_COMMUNITY)
Admission: RE | Admit: 2023-01-31 | Discharge: 2023-01-31 | Disposition: A | Discharge status: Home / Self Care | Payer: Medicare Other | Source: Ambulatory Visit | Attending: Orthopaedic Surgery | Admitting: Orthopaedic Surgery

## 2023-01-31 DIAGNOSIS — M545 Low back pain, unspecified: Secondary | ICD-10-CM | POA: Diagnosis not present

## 2023-01-31 DIAGNOSIS — G8929 Other chronic pain: Secondary | ICD-10-CM | POA: Diagnosis not present

## 2023-01-31 DIAGNOSIS — M4316 Spondylolisthesis, lumbar region: Secondary | ICD-10-CM | POA: Diagnosis not present

## 2023-01-31 NOTE — Progress Notes (Signed)
Patient was scheduled for MRI this morning at 7a, arrive at 6:45a, patient was a No call no show.

## 2023-02-04 DIAGNOSIS — E109 Type 1 diabetes mellitus without complications: Secondary | ICD-10-CM | POA: Diagnosis not present

## 2023-02-11 DIAGNOSIS — G4733 Obstructive sleep apnea (adult) (pediatric): Secondary | ICD-10-CM | POA: Diagnosis not present

## 2023-02-14 ENCOUNTER — Ambulatory Visit: Payer: No Typology Code available for payment source | Admitting: Orthopaedic Surgery

## 2023-02-14 VITALS — Ht 68.0 in | Wt 160.0 lb

## 2023-02-14 DIAGNOSIS — M47816 Spondylosis without myelopathy or radiculopathy, lumbar region: Secondary | ICD-10-CM

## 2023-02-14 NOTE — Progress Notes (Signed)
Office Visit Note   Patient: Randy [name]           Date of Birth: [birth year]           MRN: [id number] Visit Date: 02/14/2023              Requested by: Margorie John, MD [address] [place],  [place] [address] PCP: Margorie John, MD   Assessment & Plan: Visit Diagnoses:  1. Spondylosis without myelopathy or radiculopathy, lumbar region     Plan: Recheck 3 months.  If he has significant increase in pain problems or ambulation or working in let us know we discussed 1 level instrumented fusion which would be necessary.  He has considerable vertebral body edema from rapid degeneration of the disc.  His lumbar decompression has given him adequate canal space and he does not have claudication symptoms.  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  Recheck 3 months.  Follow-Up Instructions: Return in about 3 months (around 05/17/2023).   Orders:  No orders of the defined types were placed in this encounter.  No orders of the defined types were placed in this encounter.     Procedures: No procedures performed   Clinical Data: No additional findings.   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  Decompression L2-3 in 2019.  He said primarily problems in the morning with back pain and stiffness that gets better as he works and by later in the day he is having minimal symptoms.  MRI scan has been obtained which shows disc base collapse some retrolisthesis broad-based disc bulge and previous laminectomy decompression.  He does have some moderate left foraminal stenosis and mild right foraminal stenosis  No significant progression at other levels.  He is still ambulatory and working daily.  Review of Systems all systems noncontributory HPI.   Objective: Vital Signs: Ht 5\' 8"  (1.727 m)   Wt 160 lb (72.6 kg)   BMI  24.33 kg/m   Physical Exam Constitutional:      Appearance: He is well-developed.  HENT:     Head: Normocephalic and atraumatic.     Right Ear: External ear normal.     Left Ear: External ear normal.  Eyes:     Pupils: Pupils are equal, round, and reactive to light.  Neck:     Thyroid: No thyromegaly.     Trachea: No tracheal deviation.  Cardiovascular:     Rate and Rhythm: Normal rate.  Pulmonary:     Effort: Pulmonary effort is normal.     Breath sounds: No wheezing.  Abdominal:     General: Bowel sounds are normal.     Palpations: Abdomen is soft.  Musculoskeletal:     Cervical back: Neck supple.  Skin:    General: Skin is warm and dry.     Capillary Refill: Capillary refill takes less than 2 seconds.  Neurological:     Mental Status: He is alert and oriented to person, place, and time.  Psychiatric:        Behavior: Behavior normal.        Thought Content: Thought content normal.        Judgment: Judgment normal.     Ortho Exam well-healed lumbar incision good leg strength amatory without limp.  Specialty Comments:  No specialty comments available.  Imaging: CLINICAL DATA:  Low back pain   EXAM: MRI LUMBAR SPINE WITHOUT CONTRAST   TECHNIQUE: Multiplanar, multisequence MR imaging of the lumbar spine was performed. No intravenous contrast was administered.   COMPARISON:  None Available.   FINDINGS: Segmentation:  Standard.   Alignment:  3 mm retrolisthesis of L2 on L3.   Vertebrae: No acute fracture, evidence of discitis, or aggressive bone lesion.   Conus medullaris and cauda equina: Conus extends to the L1 level. Conus and cauda equina appear normal.   Paraspinal and other soft tissues: No acute paraspinal abnormality.   Other: Mild osteoarthritis of the right SI joint partially visualized.   Disc levels:   Disc spaces: Degenerative disease with severe disc height loss and reactive endplate edema at Z6-1. Disc desiccation throughout  the remainder of lumbar spine.   T12-L1: No significant disc bulge. No neural foraminal stenosis. No central canal stenosis.   L1-L2: Mild broad-based disc bulge. Mild bilateral facet arthropathy. No foraminal or central canal stenosis.   L2-L3: Broad-based disc osteophyte complex with a broad central disc protrusion. Prior laminectomy. Mild bilateral facet arthropathy. Bilateral subarticular recess stenosis. Mild right and moderate left foraminal stenosis.   L3-L4: Broad-based disc osteophyte complex. Mild bilateral facet arthropathy. Mild spinal stenosis. Mild right and moderate left foraminal stenosis. Right subarticular recess stenosis.   L4-L5: Broad-based disc bulge with a broad central/left paracentral disc protrusion. Moderate bilateral facet arthropathy. Bilateral subarticular recess stenosis. Moderate bilateral foraminal stenosis.   L5-S1: Mild broad-based disc bulge. Moderate bilateral facet arthropathy. No foraminal or central canal stenosis.   IMPRESSION: 1. Severe degenerative disease with disc height loss and reactive endplate edema at W9-6. At L2-3 there is a broad-based disc osteophyte complex with a broad central disc protrusion. Prior laminectomy. Mild bilateral facet arthropathy. Bilateral subarticular recess stenosis. Mild right and moderate left foraminal stenosis. 2. At L3-4 there is a broad-based disc osteophyte complex. Mild bilateral facet arthropathy. Mild spinal stenosis. Mild right and moderate left foraminal stenosis. Right subarticular recess stenosis. 3. At L4-5 there is a broad-based disc bulge with a broad central/left paracentral disc protrusion. Moderate bilateral facet arthropathy. Bilateral subarticular recess stenosis. Moderate bilateral foraminal stenosis. 4. No acute osseous injury of the lumbar spine.     Electronically Signed   By: Elige Ko M.D.   On: 02/10/2023 09:54     PMFS History: Patient Active Problem List    Diagnosis Date Noted   Spondylosis without myelopathy or radiculopathy, lumbar region 02/14/2023   Epistaxis 04/05/2021   Lesion of nasal septum 04/05/2021   HNP (herniated nucleus pulposus), lumbar 09/30/2017   Prostate cancer (HCC) 04/11/2016   Cervical spondylosis with myelopathy 03/04/2014   Past Medical History:  Diagnosis Date   Anxiety    HAS  PTSD   BPH (benign prostatic hypertrophy)    Cancer (HCC)    PROSTATE   Cataracts, bilateral    CKD (chronic kidney disease), stage III (HCC)    Cough    FOR 1 YEAR WITH THICK WHITE PHLEGM   Difficult airway for intubation, subsequent encounter 09/30/2017   DLx1, miller 3, grade 1   Difficult intubation 2015   KEPT OVERNIGHT WITH LAST NECK SURGERY, TOLD TUBE WAS HARDER TO INSERT   ED (erectile dysfunction)    GERD (gastroesophageal reflux disease)    H/O bladder problems    Hepatitis    "yellow jaundice" - 1960's   History of adenomatous polyp of colon  History of condyloma acuminatum    History of syncope    2003-- ? idiology--  per echo ef >55%   Hyperlipidemia    Hypertension    Insulin dependent type 2 diabetes mellitus (HCC)    Lumbar herniated disc    Pulposus   Malfunction of penile prosthesis (HCC)    OA (osteoarthritis)    hips   OSA on CPAP    severe OSA  per study 07-01-2002  (done at [facility])  occasionally uses cpap   Peripheral neuropathy    Pneumonia 2015   PTSD (post-traumatic stress disorder)    PTSD (post-traumatic stress disorder)    Sleep apnea    Spondylolysis of cervical region    Wears glasses     Family History  Problem Relation Age of Onset   Stroke Mother    Stroke Father    Colon cancer Neg Hx    Colon polyps Neg Hx    Crohn's disease Neg Hx    Esophageal cancer Neg Hx    Rectal cancer Neg Hx    Stomach cancer Neg Hx    Ulcerative colitis Neg Hx     Past Surgical History:  Procedure Laterality Date   ANTERIOR CERVICAL DECOMP/DISCECTOMY FUSION N/A 03/04/2014   Procedure: ANTERIOR  CERVICAL DECOMPRESSION/DISCECTOMY FUSION 2 LEVELS;  Surgeon: Cristi Loron, MD;  [facility];  Service: Neurosurgery;  Laterality: N/A;  C34 C45 anterior cervical decompression with fusion interbody prosthesis plating and bonegraft   CARPAL TUNNEL RELEASE Right 03/2004   CERVICAL FORAMENECTOMY  Nov 2005//   2000   right  C7 -- T1/   yr 2000 unknown level   COLONOSCOPY     HERNIA REPAIR     LUMBAR LAMINECTOMY/DECOMPRESSION MICRODISCECTOMY N/A 09/30/2017   Procedure: L2-3 LAMINECTOMY WITH MICRODISCECTOMY;  Surgeon: Eldred Manges, MD;  [facility];  Service: Orthopedics;  Laterality: N/A;   NASAL ENDOSCOPY Left 04/05/2021   Procedure: NASAL ENDOSCOPY With Biopsy;  Surgeon: Laren Boom, DO;  Location: [facility];  Service: ENT;  Laterality: Left;   NASAL HEMORRHAGE CONTROL Left 04/05/2021   Procedure: EPISTAXIS CONTROL;  Surgeon: Laren Boom, DO;  Location: [facility];  Service: ENT;  Laterality: Left;   PENILE PROSTHESIS IMPLANT N/A 03/04/2015   Procedure: REMOVAL AND REPLACMENT PENILE PROTHESIS INFLATABLE COLOPLAST INFRAPUBIC APPROACH;  Surgeon: Ihor Gully, MD;  Location: [facility];  Service: Urology;  Laterality: N/A;   PENILE PROSTHESIS PLACEMENT  1998   REMOVAL CYST FROM CHEEK  08/2014   benign   ROBOT ASSISTED LAPAROSCOPIC RADICAL PROSTATECTOMY N/A 04/11/2016   Procedure: XI ROBOTIC ASSISTED LAPAROSCOPIC RADICAL PROSTATECTOMY;  Surgeon: Crist Fat, MD;  Location: [facility];  Service: Urology;  Laterality: N/A;   Social History   Occupational History   Not on file  Tobacco Use   Smoking status: Every Day    Packs/day: 0.50    Years: 51.00    Additional pack years: 0.00    Total pack years: 25.50    Types: Cigarettes    Passive exposure: Past   Smokeless tobacco: Never  Vaping Use   Vaping Use: Never used  Substance and Sexual Activity   Alcohol use: No    Comment: very  rare   moonshine    Drug use: No   Sexual activity: Not on file

## 2023-03-06 DIAGNOSIS — I739 Peripheral vascular disease, unspecified: Secondary | ICD-10-CM | POA: Diagnosis not present

## 2023-03-06 DIAGNOSIS — E109 Type 1 diabetes mellitus without complications: Secondary | ICD-10-CM | POA: Diagnosis not present

## 2023-03-13 DIAGNOSIS — G4733 Obstructive sleep apnea (adult) (pediatric): Secondary | ICD-10-CM | POA: Diagnosis not present

## 2023-03-14 ENCOUNTER — Encounter: Payer: Self-pay | Admitting: Orthopaedic Surgery

## 2023-03-14 ENCOUNTER — Ambulatory Visit: Payer: No Typology Code available for payment source | Admitting: Orthopaedic Surgery

## 2023-03-14 VITALS — Ht 68.0 in | Wt 160.0 lb

## 2023-03-14 DIAGNOSIS — M47816 Spondylosis without myelopathy or radiculopathy, lumbar region: Secondary | ICD-10-CM | POA: Diagnosis not present

## 2023-03-14 DIAGNOSIS — M5136 Other intervertebral disc degeneration, lumbar region: Secondary | ICD-10-CM | POA: Diagnosis not present

## 2023-03-14 DIAGNOSIS — M48061 Spinal stenosis, lumbar region without neurogenic claudication: Secondary | ICD-10-CM

## 2023-03-14 NOTE — Progress Notes (Signed)
Office Visit Note   Patient: Randy [name]           Date of Birth: Jan 07, 1946           MRN: [id number] Visit Date: 03/14/2023              Requested by: Margorie John, MD [address] [place],  [place] [address] PCP: Margorie John, MD   Assessment & Plan: Visit Diagnoses:  1. Spondylosis without myelopathy or radiculopathy, lumbar region   2. Other intervertebral disc degeneration, lumbar region   3. Lumbar foraminal stenosis     Plan: Patient states he wants to consider proceeding with lumbar fusion in the fall due to ongoing persistent symptoms now greater than the year failed therapy, prednisone.  Recheck September.  When he returns we will obtain AP lateral lumbar x-ray and lateral flexion-extension lumbar radiographs on return.  2021 images MRI reviewed.  Also 02/10/2023 MRI.  Follow-Up Instructions: Return in about 2 months (around 05/15/2023).   Orders:  No orders of the defined types were placed in this encounter.  No orders of the defined types were placed in this encounter.     Procedures: No procedures performed   Clinical Data: No additional findings.   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  Previous decompression 4 years ago 2019 at L2-3.  Follow-up MRI shows collapse of the disc space and significant endplate and vertebral body edema with rapid decompression of the disc space.  Review of Systems positive for stage III kidney disease.  All other systems noncontributory HPI.   Objective: Vital Signs: Ht 5\' 8"  (1.727 m)   Wt 160 lb (72.6 kg)   BMI 24.33 kg/m   Physical Exam Constitutional:      Appearance: He is well-developed.  HENT:     Head: Normocephalic and atraumatic.     Right Ear: External ear normal.     Left Ear: External ear normal.  Eyes:      Pupils: Pupils are equal, round, and reactive to light.  Neck:     Thyroid: No thyromegaly.     Trachea: No tracheal deviation.  Cardiovascular:     Rate and Rhythm: Normal rate.  Pulmonary:     Effort: Pulmonary effort is normal.     Breath sounds: No wheezing.  Abdominal:     General: Bowel sounds are normal.     Palpations: Abdomen is soft.  Musculoskeletal:     Cervical back: Neck supple.  Skin:    General: Skin is warm and dry.     Capillary Refill: Capillary refill takes less than 2 seconds.  Neurological:     Mental Status: He is alert and oriented to person, place, and time.  Psychiatric:        Behavior: Behavior normal.        Thought Content: Thought content normal.        Judgment: Judgment normal.     Ortho Exam patient gets send standing slowly has some trouble reaching full extension position.  Negative logroll to hips no atrophy lower extremities well-healed lumbar incision.  Normal heel-toe gait.  Specialty Comments:  No specialty comments available.  Imaging: No results found.   PMFS History: Patient Active Problem List   Diagnosis Date Noted   Other intervertebral disc  degeneration, lumbar region 03/14/2023   Lumbar foraminal stenosis 03/14/2023   Spondylosis without myelopathy or radiculopathy, lumbar region 02/14/2023   Epistaxis 04/05/2021   Lesion of nasal septum 04/05/2021   HNP (herniated nucleus pulposus), lumbar 09/30/2017   Prostate cancer (HCC) 04/11/2016   Cervical spondylosis with myelopathy 03/04/2014   Past Medical History:  Diagnosis Date   Anxiety    HAS  PTSD   BPH (benign prostatic hypertrophy)    Cancer (HCC)    PROSTATE   Cataracts, bilateral    CKD (chronic kidney disease), stage III (HCC)    Cough    FOR 1 YEAR WITH THICK WHITE PHLEGM   Difficult airway for intubation, subsequent encounter 09/30/2017   DLx1, miller 3, grade 1   Difficult intubation 2015   KEPT OVERNIGHT WITH LAST NECK SURGERY, TOLD TUBE WAS HARDER  TO INSERT   ED (erectile dysfunction)    GERD (gastroesophageal reflux disease)    H/O bladder problems    Hepatitis    "yellow jaundice" - 1960's   History of adenomatous polyp of colon    History of condyloma acuminatum    History of syncope    2003-- ? idiology--  per echo ef >55%   Hyperlipidemia    Hypertension    Insulin dependent type 2 diabetes mellitus (HCC)    Lumbar herniated disc    Pulposus   Malfunction of penile prosthesis (HCC)    OA (osteoarthritis)    hips   OSA on CPAP    severe OSA  per study 07-01-2002  (done at [facility])  occasionally uses cpap   Peripheral neuropathy    Pneumonia 2015   PTSD (post-traumatic stress disorder)    PTSD (post-traumatic stress disorder)    Sleep apnea    Spondylolysis of cervical region    Wears glasses     Family History  Problem Relation Age of Onset   Stroke Mother    Stroke Father    Colon cancer Neg Hx    Colon polyps Neg Hx    Crohn's disease Neg Hx    Esophageal cancer Neg Hx    Rectal cancer Neg Hx    Stomach cancer Neg Hx    Ulcerative colitis Neg Hx     Past Surgical History:  Procedure Laterality Date   ANTERIOR CERVICAL DECOMP/DISCECTOMY FUSION N/A 03/04/2014   Procedure: ANTERIOR CERVICAL DECOMPRESSION/DISCECTOMY FUSION 2 LEVELS;  Surgeon: Cristi Loron, MD;  [facility];  Service: Neurosurgery;  Laterality: N/A;  C34 C45 anterior cervical decompression with fusion interbody prosthesis plating and bonegraft   CARPAL TUNNEL RELEASE Right 03/2004   CERVICAL FORAMENECTOMY  Nov 2005//   2000   right  C7 -- T1/   yr 2000 unknown level   COLONOSCOPY     HERNIA REPAIR     LUMBAR LAMINECTOMY/DECOMPRESSION MICRODISCECTOMY N/A 09/30/2017   Procedure: L2-3 LAMINECTOMY WITH MICRODISCECTOMY;  Surgeon: Eldred Manges, MD;  [facility];  Service: Orthopedics;  Laterality: N/A;   NASAL ENDOSCOPY Left 04/05/2021   Procedure: NASAL ENDOSCOPY With Biopsy;  Surgeon: Laren Boom, DO;  Location: [name];  Service: ENT;  Laterality: Left;   NASAL HEMORRHAGE CONTROL Left 04/05/2021   Procedure: EPISTAXIS CONTROL;  Surgeon: Laren Boom, DO;  Location: [facility];  Service: ENT;  Laterality: Left;   PENILE PROSTHESIS IMPLANT N/A 03/04/2015   Procedure: REMOVAL AND REPLACMENT PENILE PROTHESIS INFLATABLE COLOPLAST INFRAPUBIC APPROACH;  Surgeon: Ihor Gully, MD;  Location:  [facility];  Service: Urology;  Laterality: N/A;   PENILE PROSTHESIS PLACEMENT  1998   REMOVAL CYST FROM CHEEK  08/2014   benign   ROBOT ASSISTED LAPAROSCOPIC RADICAL PROSTATECTOMY N/A 04/11/2016   Procedure: XI ROBOTIC ASSISTED LAPAROSCOPIC RADICAL PROSTATECTOMY;  Surgeon: Crist Fat, MD;  Location: [facility];  Service: Urology;  Laterality: N/A;   Social History   Occupational History   Not on file  Tobacco Use   Smoking status: Every Day    Current packs/day: 0.50    Average packs/day: 0.5 packs/day for 51.0 years (25.5 ttl pk-yrs)    Types: Cigarettes    Passive exposure: Past   Smokeless tobacco: Never  Vaping Use   Vaping status: Never Used  Substance and Sexual Activity   Alcohol use: No    Comment: very  rare   moonshine   Drug use: No   Sexual activity: Not on file

## 2023-04-05 DIAGNOSIS — E109 Type 1 diabetes mellitus without complications: Secondary | ICD-10-CM | POA: Diagnosis not present

## 2023-04-09 ENCOUNTER — Telehealth: Payer: Self-pay | Admitting: Orthopaedic Surgery

## 2023-04-09 NOTE — Telephone Encounter (Signed)
Received vm fom Leenus w/ [facility] requesting last 2 ov notes and xrays. I faxed notes. IC,lmvm advised she will need to get the MRI from the facility where scan was done at. Fax [phone], ph [phone] ext [phone]

## 2023-04-13 DIAGNOSIS — G4733 Obstructive sleep apnea (adult) (pediatric): Secondary | ICD-10-CM | POA: Diagnosis not present

## 2023-05-14 DIAGNOSIS — G4733 Obstructive sleep apnea (adult) (pediatric): Secondary | ICD-10-CM | POA: Diagnosis not present

## 2023-05-16 ENCOUNTER — Ambulatory Visit: Payer: No Typology Code available for payment source | Admitting: Orthopaedic Surgery

## 2023-05-23 ENCOUNTER — Ambulatory Visit: Payer: No Typology Code available for payment source | Admitting: Orthopaedic Surgery

## 2023-06-13 DIAGNOSIS — G4733 Obstructive sleep apnea (adult) (pediatric): Secondary | ICD-10-CM | POA: Diagnosis not present

## 2023-07-11 DIAGNOSIS — G4733 Obstructive sleep apnea (adult) (pediatric): Secondary | ICD-10-CM | POA: Diagnosis not present

## 2023-10-15 DIAGNOSIS — Z720 Tobacco use: Secondary | ICD-10-CM | POA: Diagnosis not present

## 2023-10-15 DIAGNOSIS — I70232 Atherosclerosis of native arteries of right leg with ulceration of calf: Secondary | ICD-10-CM | POA: Diagnosis not present

## 2023-10-23 DIAGNOSIS — I83022 Varicose veins of left lower extremity with ulcer of calf: Secondary | ICD-10-CM | POA: Diagnosis not present

## 2023-10-23 DIAGNOSIS — M792 Neuralgia and neuritis, unspecified: Secondary | ICD-10-CM | POA: Diagnosis not present

## 2023-10-23 DIAGNOSIS — L97522 Non-pressure chronic ulcer of other part of left foot with fat layer exposed: Secondary | ICD-10-CM | POA: Diagnosis not present

## 2023-10-23 DIAGNOSIS — L97422 Non-pressure chronic ulcer of left heel and midfoot with fat layer exposed: Secondary | ICD-10-CM | POA: Diagnosis not present

## 2023-10-23 DIAGNOSIS — L853 Xerosis cutis: Secondary | ICD-10-CM | POA: Diagnosis not present

## 2023-10-23 DIAGNOSIS — L97222 Non-pressure chronic ulcer of left calf with fat layer exposed: Secondary | ICD-10-CM | POA: Diagnosis not present

## 2023-11-13 DIAGNOSIS — L97222 Non-pressure chronic ulcer of left calf with fat layer exposed: Secondary | ICD-10-CM | POA: Diagnosis not present

## 2023-11-13 DIAGNOSIS — L97522 Non-pressure chronic ulcer of other part of left foot with fat layer exposed: Secondary | ICD-10-CM | POA: Diagnosis not present

## 2023-11-13 DIAGNOSIS — B353 Tinea pedis: Secondary | ICD-10-CM | POA: Diagnosis not present

## 2023-11-13 DIAGNOSIS — M792 Neuralgia and neuritis, unspecified: Secondary | ICD-10-CM | POA: Diagnosis not present

## 2023-11-13 DIAGNOSIS — I83022 Varicose veins of left lower extremity with ulcer of calf: Secondary | ICD-10-CM | POA: Diagnosis not present

## 2023-11-13 DIAGNOSIS — L97422 Non-pressure chronic ulcer of left heel and midfoot with fat layer exposed: Secondary | ICD-10-CM | POA: Diagnosis not present

## 2023-11-13 DIAGNOSIS — L853 Xerosis cutis: Secondary | ICD-10-CM | POA: Diagnosis not present

## 2024-01-13 DIAGNOSIS — Z01810 Encounter for preprocedural cardiovascular examination: Secondary | ICD-10-CM | POA: Diagnosis not present

## 2024-01-13 DIAGNOSIS — R911 Solitary pulmonary nodule: Secondary | ICD-10-CM | POA: Diagnosis not present

## 2024-01-14 ENCOUNTER — Encounter: Payer: Self-pay | Admitting: Emergency Medicine

## 2024-01-14 ENCOUNTER — Ambulatory Visit
Admission: EM | Admit: 2024-01-14 | Discharge: 2024-01-14 | Disposition: A | Discharge status: Home / Self Care | Attending: Nurse Practitioner | Admitting: Nurse Practitioner

## 2024-01-14 DIAGNOSIS — L989 Disorder of the skin and subcutaneous tissue, unspecified: Secondary | ICD-10-CM

## 2024-01-14 MED ORDER — DOXYCYCLINE HYCLATE 100 MG PO CAPS: 100.0000 mg | ORAL_CAPSULE | Freq: Two times a day (BID) | ORAL | 0 refills | Status: AC

## 2024-01-14 NOTE — ED Provider Notes (Signed)
 [facility]    CSN: [id number] Arrival date & time: 01/14/24  1422      History   Chief Complaint No chief complaint on file.   HPI Randy [name] is a 78 y.o. male.   Patient presents today with 2 day history of right eye swelling and tearing.  Reports symptoms began when he woke up on Sunday.  No fever, nausea/vomiting.  Report the skin on the side of his face is swollen and "it feels like there is a chunk of something" under the skin.  The area is starting to drain a little bit.  No recent change in any soaps, detergents, or personal care products.  Patient tried to call his PCP for an appointment and was not able to get in with PCP.  Has not tried anything for symptoms so far.   Patient reports history of 2 subcutaneous cysts removed from the right side of his face in the past.   Patient is a [place] Veteran and follows with PCP at [facility]    Past Medical History:  Diagnosis Date   Anxiety    HAS  PTSD   BPH (benign prostatic hypertrophy)    Cancer (HCC)    PROSTATE   Cataracts, bilateral    CKD (chronic kidney disease), stage III (HCC)    Cough    FOR 1 YEAR WITH THICK WHITE PHLEGM   Difficult airway for intubation, subsequent encounter 09/30/2017   DLx1, miller 3, grade 1   Difficult intubation 2015   KEPT OVERNIGHT WITH LAST NECK SURGERY, TOLD TUBE WAS HARDER TO INSERT   ED (erectile dysfunction)    GERD (gastroesophageal reflux disease)    H/O bladder problems    Hepatitis    "yellow jaundice" - 1960's   History of adenomatous polyp of colon    History of condyloma acuminatum    History of syncope    2003-- ? idiology--  per echo ef >55%   Hyperlipidemia    Hypertension    Insulin  dependent type 2 diabetes mellitus (HCC)    Lumbar herniated disc    Pulposus   Malfunction of penile prosthesis (HCC)    OA (osteoarthritis)    hips   OSA on CPAP    severe OSA  per study 07-01-2002  (done at [facility])  occasionally uses cpap   Peripheral  neuropathy    Pneumonia 2015   PTSD (post-traumatic stress disorder)    PTSD (post-traumatic stress disorder)    Sleep apnea    Spondylolysis of cervical region    Wears glasses     Patient Active Problem List   Diagnosis Date Noted   Other intervertebral disc degeneration, lumbar region 03/14/2023   Lumbar foraminal stenosis 03/14/2023   Spondylosis without myelopathy or radiculopathy, lumbar region 02/14/2023   Epistaxis 04/05/2021   Lesion of nasal septum 04/05/2021   HNP (herniated nucleus pulposus), lumbar 09/30/2017   Prostate cancer (HCC) 04/11/2016   Cervical spondylosis with myelopathy 03/04/2014    Past Surgical History:  Procedure Laterality Date   ANTERIOR CERVICAL DECOMP/DISCECTOMY FUSION N/A 03/04/2014   Procedure: ANTERIOR CERVICAL DECOMPRESSION/DISCECTOMY FUSION 2 LEVELS;  Surgeon: Elder Greening, MD;  Location: [facility];  Service: Neurosurgery;  Laterality: N/A;  C34 C45 anterior cervical decompression with fusion interbody prosthesis plating and bonegraft   CARPAL TUNNEL RELEASE Right 03/2004   CERVICAL FORAMENECTOMY  Nov 2005//   2000   right  C7 -- T1/   yr 2000 unknown level   COLONOSCOPY  HERNIA REPAIR     LUMBAR LAMINECTOMY/DECOMPRESSION MICRODISCECTOMY N/A 09/30/2017   Procedure: L2-3 LAMINECTOMY WITH MICRODISCECTOMY;  Surgeon: Adah Acron, MD;  Location: [facility];  Service: Orthopedics;  Laterality: N/A;   NASAL ENDOSCOPY Left 04/05/2021   Procedure: NASAL ENDOSCOPY With Biopsy;  Surgeon: Daleen Dubs, DO;  Location: [facility];  Service: ENT;  Laterality: Left;   NASAL HEMORRHAGE CONTROL Left 04/05/2021   Procedure: EPISTAXIS CONTROL;  Surgeon: Daleen Dubs, DO;  Location: [facility];  Service: ENT;  Laterality: Left;   PENILE PROSTHESIS IMPLANT N/A 03/04/2015   Procedure: REMOVAL AND REPLACMENT PENILE PROTHESIS INFLATABLE COLOPLAST INFRAPUBIC APPROACH;  Surgeon: Trudee Furth, MD;  Location: [facility]  [place];  Service: Urology;  Laterality: N/A;   PENILE PROSTHESIS PLACEMENT  1998   REMOVAL CYST FROM CHEEK  08/2014   benign   ROBOT ASSISTED LAPAROSCOPIC RADICAL PROSTATECTOMY N/A 04/11/2016   Procedure: XI ROBOTIC ASSISTED LAPAROSCOPIC RADICAL PROSTATECTOMY;  Surgeon: Andrez Banker, MD;  Location: [facility];  Service: Urology;  Laterality: N/A;       Home Medications    Prior to Admission medications   Medication Sig Start Date End Date Taking? Authorizing Provider  doxycycline (VIBRAMYCIN) 100 MG capsule Take 1 capsule (100 mg total) by mouth 2 (two) times daily for 7 days. 01/14/24 01/21/24 Yes Wilhemena Harbour, NP  allopurinol  (ZYLOPRIM ) 100 MG tablet Take 100 mg by mouth at bedtime.    [provider]  aspirin 81 MG chewable tablet Chew 1 tablet by mouth daily.    [provider]  atorvastatin  (LIPITOR) 40 MG tablet Take 40 mg by mouth at bedtime.     [provider]  clotrimazole (LOTRIMIN) 1 % cream Apply topically.    [provider]  furosemide  (LASIX ) 20 MG tablet Take 20 mg by mouth daily.    [provider]  insulin  NPH-regular Human (NOVOLIN 70/30) (70-30) 100 UNIT/ML injection Inject 46-48 Units into the skin 2 (two) times daily. 48 units in the morning and 46 units in the evening.    [provider]  lisinopril  (PRINIVIL ,ZESTRIL ) 40 MG tablet Take 20 mg by mouth every morning. Takes 0.5 tablet    [provider]  Multiple Vitamins-Minerals (CENTRUM SILVER  50+MEN) TABS Take 1 tablet by mouth daily.    [provider]  oxybutynin  (DITROPAN ) 5 MG tablet Take 5 mg by mouth 2 (two) times daily.    [provider]  oxymetazoline  (AFRIN NASAL SPRAY) 0.05 % nasal spray Place 3 sprays into both nostrils in the morning and at bedtime. 11/12/20   [provider]  polyethylene glycol powder (GLYCOLAX /MIRALAX ) powder Take 17 g by mouth daily as needed for mild constipation.     [provider]    Family History Family History  Problem Relation Age of Onset   Stroke Mother    Stroke Father    Colon cancer Neg Hx    Colon polyps Neg Hx    Crohn's disease Neg Hx    Esophageal cancer Neg Hx    Rectal cancer Neg Hx    Stomach cancer Neg Hx    Ulcerative colitis Neg Hx     Social History Social History   Tobacco Use   Smoking status: Every Day    Current packs/day: 0.50    Average packs/day: 0.5 packs/day for 51.0 years (25.5 ttl pk-yrs)    Types: Cigarettes    Passive exposure: Past   Smokeless tobacco:  Never  Vaping Use   Vaping status: Never Used  Substance Use Topics   Alcohol use: No    Comment: very  rare   moonshine   Drug use: No     Allergies   Cetirizine & related   Review of Systems Review of Systems Per HPI  Physical Exam Triage Vital Signs ED Triage Vitals  Encounter Vitals Group     BP 01/14/24 1430 127/73     Systolic BP Percentile --      Diastolic BP Percentile --      Pulse Rate 01/14/24 1430 (!) 103     Resp 01/14/24 1430 18     Temp 01/14/24 1430 98.7 F (37.1 C)     Temp Source 01/14/24 1430 Oral     SpO2 01/14/24 1430 98 %     Weight --      Height --      Head Circumference --      Peak Flow --      Pain Score 01/14/24 1431 3     Pain Loc --      Pain Education --      Exclude from Growth Chart --    No data found.  Updated Vital Signs BP 127/73 (BP Location: Right Arm)   Pulse (!) 103   Temp 98.7 F (37.1 C) (Oral)   Resp 18   SpO2 98%   Visual Acuity Right Eye Distance:   Left Eye Distance:   Bilateral Distance:    Right Eye Near:   Left Eye Near:    Bilateral Near:     Physical Exam Vitals and nursing note reviewed.  Constitutional:      General: He is not in acute distress.    Appearance: Normal appearance. He is not toxic-appearing.  HENT:     Head: Normocephalic and atraumatic.     Right Ear: External ear normal.     Left Ear: External ear normal.     Mouth/Throat:      Mouth: Mucous membranes are moist.     Pharynx: Oropharynx is clear. No oropharyngeal exudate or posterior oropharyngeal erythema.  Eyes:     General:        Right eye: Discharge (clear discharge) present.        Left eye: No discharge.     Extraocular Movements: Extraocular movements intact.  Pulmonary:     Effort: Pulmonary effort is normal. No respiratory distress.  Musculoskeletal:     Cervical back: Normal range of motion.  Lymphadenopathy:     Cervical: No cervical adenopathy.  Skin:    Capillary Refill: Capillary refill takes less than 2 seconds.     Findings: Lesion present.     Comments: Hyperpigmented lesion to right side of face; see photograph below.  The area is scaly, raised, indurated with a couple of small pustules near the center.  Size approximately 4 cm x 6 cm   Neurological:     Mental Status: He is alert.       UC Treatments / Results  Labs (all labs ordered are listed, but only abnormal results are displayed) Labs Reviewed - No data to display  EKG   Radiology No results found.  Procedures Procedures (including critical care time)  Medications Ordered in UC Medications - No data to display  Initial Impression / Assessment and Plan / UC Course  I have reviewed the triage vital signs and the nursing notes.  Pertinent labs & imaging results that were  available during my care of the patient were reviewed by me and considered in my medical decision making (see chart for details).   Patient is well-appearing, normotensive, afebrile, not tachypneic, oxygenating well on room air.  Patient is mildly tachycardic in triage today.  1. Facial lesion Unclear etiology Exam findings are concerning for infectious process vs. Reactive process among other differentials including neoplasm Given pustules, will start oral doxycycline and recommended close follow up with PCP within the next 1-2 weeks Wound care discussed in the meantime  The patient was given  the opportunity to ask questions.  All questions answered to their satisfaction.  The patient is in agreement to this plan.   Final Clinical Impressions(s) / UC Diagnoses   Final diagnoses:  Facial lesion   Discharge Instructions      Start taking the doxycycline twice daily for 7 days as prescribed to treat any infection in the skin on your face.  Please follow up with your PCP for further evaluation and management if symptoms do not improve significantly in the next couple of days.  ED Prescriptions     Medication Sig Dispense Auth. Provider   doxycycline (VIBRAMYCIN) 100 MG capsule Take 1 capsule (100 mg total) by mouth 2 (two) times daily for 7 days. 14 capsule Wilhemena Harbour, NP      PDMP not reviewed this encounter.   Wilhemena Harbour, NP 01/14/24 1515

## 2024-01-14 NOTE — ED Triage Notes (Signed)
 Area under right eye swollen since Sunday.  States area is painful and itches

## 2024-01-14 NOTE — Discharge Instructions (Signed)
 Start taking the doxycycline twice daily for 7 days as prescribed to treat any infection in the skin on your face.  Please follow up with your PCP for further evaluation and management if symptoms do not improve significantly in the next couple of days.

## 2024-02-18 DIAGNOSIS — G4733 Obstructive sleep apnea (adult) (pediatric): Secondary | ICD-10-CM | POA: Diagnosis not present

## 2024-03-05 DIAGNOSIS — C3482 Malignant neoplasm of overlapping sites of left bronchus and lung: Secondary | ICD-10-CM | POA: Diagnosis not present

## 2024-03-21 DIAGNOSIS — L03115 Cellulitis of right lower limb: Secondary | ICD-10-CM | POA: Diagnosis not present

## 2024-03-21 DIAGNOSIS — S51801A Unspecified open wound of right forearm, initial encounter: Secondary | ICD-10-CM | POA: Diagnosis not present

## 2024-03-21 DIAGNOSIS — T07XXXA Unspecified multiple injuries, initial encounter: Secondary | ICD-10-CM | POA: Diagnosis not present

## 2024-03-22 DIAGNOSIS — M85872 Other specified disorders of bone density and structure, left ankle and foot: Secondary | ICD-10-CM | POA: Diagnosis not present

## 2024-03-22 DIAGNOSIS — I739 Peripheral vascular disease, unspecified: Secondary | ICD-10-CM | POA: Diagnosis not present

## 2024-03-22 DIAGNOSIS — Z0389 Encounter for observation for other suspected diseases and conditions ruled out: Secondary | ICD-10-CM | POA: Diagnosis not present

## 2024-03-22 DIAGNOSIS — S91302A Unspecified open wound, left foot, initial encounter: Secondary | ICD-10-CM | POA: Diagnosis not present

## 2024-03-22 DIAGNOSIS — M25521 Pain in right elbow: Secondary | ICD-10-CM | POA: Diagnosis not present

## 2024-03-22 DIAGNOSIS — Z9889 Other specified postprocedural states: Secondary | ICD-10-CM | POA: Diagnosis not present

## 2024-03-22 DIAGNOSIS — M129 Arthropathy, unspecified: Secondary | ICD-10-CM | POA: Diagnosis not present

## 2024-03-22 DIAGNOSIS — C3491 Malignant neoplasm of unspecified part of right bronchus or lung: Secondary | ICD-10-CM | POA: Diagnosis not present

## 2024-03-22 DIAGNOSIS — L97522 Non-pressure chronic ulcer of other part of left foot with fat layer exposed: Secondary | ICD-10-CM | POA: Diagnosis not present

## 2024-03-22 DIAGNOSIS — L98499 Non-pressure chronic ulcer of skin of other sites with unspecified severity: Secondary | ICD-10-CM | POA: Diagnosis not present

## 2024-03-23 DIAGNOSIS — Z9889 Other specified postprocedural states: Secondary | ICD-10-CM | POA: Diagnosis not present

## 2024-03-23 DIAGNOSIS — L97529 Non-pressure chronic ulcer of other part of left foot with unspecified severity: Secondary | ICD-10-CM | POA: Diagnosis not present

## 2024-03-23 DIAGNOSIS — I739 Peripheral vascular disease, unspecified: Secondary | ICD-10-CM | POA: Diagnosis not present

## 2024-03-23 DIAGNOSIS — M19072 Primary osteoarthritis, left ankle and foot: Secondary | ICD-10-CM | POA: Diagnosis not present

## 2024-03-23 DIAGNOSIS — T07XXXA Unspecified multiple injuries, initial encounter: Secondary | ICD-10-CM | POA: Diagnosis not present

## 2024-03-23 DIAGNOSIS — R600 Localized edema: Secondary | ICD-10-CM | POA: Diagnosis not present

## 2024-03-23 DIAGNOSIS — S91302A Unspecified open wound, left foot, initial encounter: Secondary | ICD-10-CM | POA: Diagnosis not present

## 2024-03-23 DIAGNOSIS — L97522 Non-pressure chronic ulcer of other part of left foot with fat layer exposed: Secondary | ICD-10-CM | POA: Diagnosis not present

## 2024-03-24 DIAGNOSIS — S91302A Unspecified open wound, left foot, initial encounter: Secondary | ICD-10-CM | POA: Diagnosis not present

## 2024-03-25 DIAGNOSIS — S91302A Unspecified open wound, left foot, initial encounter: Secondary | ICD-10-CM | POA: Diagnosis not present

## 2024-03-26 DIAGNOSIS — T07XXXA Unspecified multiple injuries, initial encounter: Secondary | ICD-10-CM | POA: Diagnosis not present

## 2024-03-26 DIAGNOSIS — S91302A Unspecified open wound, left foot, initial encounter: Secondary | ICD-10-CM | POA: Diagnosis not present

## 2024-03-27 DIAGNOSIS — E1142 Type 2 diabetes mellitus with diabetic polyneuropathy: Secondary | ICD-10-CM | POA: Diagnosis not present

## 2024-03-27 DIAGNOSIS — Z85118 Personal history of other malignant neoplasm of bronchus and lung: Secondary | ICD-10-CM | POA: Diagnosis not present

## 2024-03-27 DIAGNOSIS — E1165 Type 2 diabetes mellitus with hyperglycemia: Secondary | ICD-10-CM | POA: Diagnosis not present

## 2024-03-27 DIAGNOSIS — T07XXXA Unspecified multiple injuries, initial encounter: Secondary | ICD-10-CM | POA: Diagnosis not present

## 2024-03-27 DIAGNOSIS — S91302A Unspecified open wound, left foot, initial encounter: Secondary | ICD-10-CM | POA: Diagnosis not present

## 2024-03-27 DIAGNOSIS — Z720 Tobacco use: Secondary | ICD-10-CM | POA: Diagnosis not present

## 2024-03-27 DIAGNOSIS — L03116 Cellulitis of left lower limb: Secondary | ICD-10-CM | POA: Diagnosis not present

## 2024-03-28 DIAGNOSIS — S91302A Unspecified open wound, left foot, initial encounter: Secondary | ICD-10-CM | POA: Diagnosis not present

## 2024-03-29 DIAGNOSIS — S91302A Unspecified open wound, left foot, initial encounter: Secondary | ICD-10-CM | POA: Diagnosis not present

## 2024-03-30 DIAGNOSIS — S91302A Unspecified open wound, left foot, initial encounter: Secondary | ICD-10-CM | POA: Diagnosis not present

## 2024-03-31 DIAGNOSIS — R609 Edema, unspecified: Secondary | ICD-10-CM | POA: Diagnosis not present

## 2024-03-31 DIAGNOSIS — M778 Other enthesopathies, not elsewhere classified: Secondary | ICD-10-CM | POA: Diagnosis not present

## 2024-03-31 DIAGNOSIS — L97529 Non-pressure chronic ulcer of other part of left foot with unspecified severity: Secondary | ICD-10-CM | POA: Diagnosis not present

## 2024-03-31 DIAGNOSIS — M129 Arthropathy, unspecified: Secondary | ICD-10-CM | POA: Diagnosis not present

## 2024-03-31 DIAGNOSIS — M7989 Other specified soft tissue disorders: Secondary | ICD-10-CM | POA: Diagnosis not present

## 2024-03-31 DIAGNOSIS — M19072 Primary osteoarthritis, left ankle and foot: Secondary | ICD-10-CM | POA: Diagnosis not present

## 2024-03-31 DIAGNOSIS — T07XXXA Unspecified multiple injuries, initial encounter: Secondary | ICD-10-CM | POA: Diagnosis not present

## 2024-04-02 DIAGNOSIS — C3490 Malignant neoplasm of unspecified part of unspecified bronchus or lung: Secondary | ICD-10-CM | POA: Diagnosis not present

## 2024-04-02 DIAGNOSIS — Z87891 Personal history of nicotine dependence: Secondary | ICD-10-CM | POA: Diagnosis not present

## 2024-04-02 DIAGNOSIS — Z79899 Other long term (current) drug therapy: Secondary | ICD-10-CM | POA: Diagnosis not present

## 2024-04-02 DIAGNOSIS — S41101D Unspecified open wound of right upper arm, subsequent encounter: Secondary | ICD-10-CM | POA: Diagnosis not present

## 2024-04-02 DIAGNOSIS — E11621 Type 2 diabetes mellitus with foot ulcer: Secondary | ICD-10-CM | POA: Diagnosis not present

## 2024-04-02 DIAGNOSIS — L97522 Non-pressure chronic ulcer of other part of left foot with fat layer exposed: Secondary | ICD-10-CM | POA: Diagnosis not present

## 2024-04-07 DIAGNOSIS — C3482 Malignant neoplasm of overlapping sites of left bronchus and lung: Secondary | ICD-10-CM | POA: Diagnosis not present

## 2024-04-07 DIAGNOSIS — R7989 Other specified abnormal findings of blood chemistry: Secondary | ICD-10-CM | POA: Diagnosis not present

## 2024-04-07 DIAGNOSIS — Z5111 Encounter for antineoplastic chemotherapy: Secondary | ICD-10-CM | POA: Diagnosis not present

## 2024-04-14 DIAGNOSIS — Z720 Tobacco use: Secondary | ICD-10-CM | POA: Diagnosis not present

## 2024-04-14 DIAGNOSIS — I70232 Atherosclerosis of native arteries of right leg with ulceration of calf: Secondary | ICD-10-CM | POA: Diagnosis not present

## 2024-04-14 DIAGNOSIS — I70244 Atherosclerosis of native arteries of left leg with ulceration of heel and midfoot: Secondary | ICD-10-CM | POA: Diagnosis not present

## 2024-04-14 DIAGNOSIS — Z95828 Presence of other vascular implants and grafts: Secondary | ICD-10-CM | POA: Diagnosis not present

## 2024-04-14 DIAGNOSIS — I70202 Unspecified atherosclerosis of native arteries of extremities, left leg: Secondary | ICD-10-CM | POA: Diagnosis not present

## 2024-04-17 DIAGNOSIS — C3492 Malignant neoplasm of unspecified part of left bronchus or lung: Secondary | ICD-10-CM | POA: Diagnosis not present

## 2024-04-17 DIAGNOSIS — E1122 Type 2 diabetes mellitus with diabetic chronic kidney disease: Secondary | ICD-10-CM | POA: Diagnosis not present

## 2024-04-17 DIAGNOSIS — N1832 Chronic kidney disease, stage 3b: Secondary | ICD-10-CM | POA: Diagnosis not present

## 2024-04-17 DIAGNOSIS — I129 Hypertensive chronic kidney disease with stage 1 through stage 4 chronic kidney disease, or unspecified chronic kidney disease: Secondary | ICD-10-CM | POA: Diagnosis not present

## 2024-04-17 DIAGNOSIS — Z794 Long term (current) use of insulin: Secondary | ICD-10-CM | POA: Diagnosis not present

## 2024-05-26 DIAGNOSIS — I739 Peripheral vascular disease, unspecified: Secondary | ICD-10-CM | POA: Diagnosis not present

## 2024-07-28 ENCOUNTER — Telehealth: Payer: Self-pay | Admitting: Orthopedic Surgery

## 2024-07-28 NOTE — Telephone Encounter (Signed)
 I called and advised that we have his MRI

## 2024-07-28 NOTE — Telephone Encounter (Signed)
 Pt called wanting to know if we have the MRI of his back yet. He has an appt on Thurs. Call back number is [phone]

## 2024-07-30 ENCOUNTER — Ambulatory Visit: Admitting: Orthopedic Surgery

## 2024-07-30 ENCOUNTER — Other Ambulatory Visit: Payer: Self-pay

## 2024-07-30 VITALS — BP 128/88 | HR 102 | Ht 68.0 in | Wt 170.0 lb

## 2024-07-30 DIAGNOSIS — M545 Low back pain, unspecified: Secondary | ICD-10-CM

## 2024-07-30 NOTE — Progress Notes (Signed)
 Orthopedic Spine Surgery Office Note  Assessment: Patient is a 78 y.o. male with mid lumbar back pain and left anterior thigh pain. Has significant DDD and left sided foraminal stenosis at L2/3.    Plan: -Explained that initially conservative treatment is tried as a significant number of patients may experience relief with these treatment modalities. Discussed that the conservative treatments include:  -activity modification  -physical therapy  -over the counter pain medications  -medrol dosepak  -lumbar steroid injections -Patient has tried  PT, tylenol , prednisone  -After discussing the full range of treatment options, patient was not interested in any further conservative treatment. He felt that he has been dealing with this for a year now and it was not going to get better with further non-operative measures. At that point, I discussed a L2/3 XLIF with PSIF as an option. After our conversation, patient elected to proceed.  -Patient will next be seen at date of surgery   The patient has low back pain and foraminal stenosis on the left at L2/3. The patient's symptoms were not improving with conservative treatment so operative management was discussed in the form of L2/3 lateral interbody fusion with posterior instrumented spinal fusion. The risks including but not limited to dural tear, nerve root injury, paralysis, pseudarthrosis, persistent pain, infection, bleeding, hardware failure, adjacent segment disease, vascular injury, psoas weakness, lumbar plexus injury, heart attack, death, stroke, fracture, dvt/pe, and need for additional procedures were discussed with the patient. Since this will be an indirect decompression, there is a risk of persistent symptoms which the patient understood after explaining the reason. The benefit of the surgery would be improvement in the patient's radiating leg pain. I explained that back pain relief is not as predictably relieved with surgery. The alternatives  to surgical management were covered with the patient and included continued monitoring, physical therapy, over-the-counter pain medications, ambulatory aids, muscle relaxers, and activity modification. All the patient's questions were answered to his satisfaction. After this discussion, the patient expressed understanding and elected to proceed with surgical intervention.     ___________________________________________________________________________   History:  Patient is a 78 y.o. male who presents today for lumbar spine.  Patient previously underwent L2/3 laminectomy with my now retired partner, Dr. Barbarann.  He had done well after surgery but within the last year has developed mid lumbar back pain.  He also has pain radiating into the left lower extremity.  He feels it going into the anterior thigh on that side.  It does not radiate past the knee.  There was no trauma or injury that preceded the onset of this worsening of his pain.  He said he has been less active and has not been able to enjoy his hobbies as a result of this pain.  Dr. Barbarann had previously talked about a fusion surgery given the significant degenerative disc that he has developed at L2/3.  He has tried multiple treatments and is tired of dealing with this pain so he is interested in pursuing with surgical treatment.  Has a history of urinary incontinence.  No recent changes in bowel or bladder habits.  No saddle anesthesia.  His main complaint is the mid lumbar back pain.  Treatments tried: PT, tylenol , prednisone  Review of systems: Denies fevers and chills, night sweats, unexplained weight loss. Has a history of lung and prostate cancer both in remission. Has had pain that wakes him at night  Past medical history: Prostate cancer CKD PTSD HLD HTN GERD DM Neuropathy Peripheral artery  disease OSA BPH Chronic pain  Allergies: cetirizine  Past surgical history:  C3-5 ACDF Cervical laminoforaminotomy Carpal tunnel  release L2/3 laminectomy Penile prosthesis placement Hernia repair  Social history: Denies use of nicotine product (smoking, vaping, patches, smokeless) Alcohol use: Yes, approximately 2 drinks per week Denies recreational drug use   Physical Exam:  BMI of 25.9  General: no acute distress, appears stated age Neurologic: alert, answering questions appropriately, following commands Respiratory: unlabored breathing on room air, symmetric chest rise Psychiatric: appropriate affect, normal cadence to speech   MSK (spine):  -Strength exam      Left  Right EHL    5/5  5/5 TA    5/5  5/5 GSC    5/5  5/5 Knee extension  5/5  5/5 Hip flexion   5/5  5/5  -Sensory exam    Sensation intact to light touch in L3-S1 nerve distributions of bilateral lower extremities  -Achilles DTR: 1/4 on the left, 1/4 on the right -Patellar tendon DTR: 1/4 on the left, 1/4 on the right  -Straight leg raise: negative bilaterally  -Clonus: no beats bilaterally  -Left hip exam: no pain through range of motion -Right hip exam: no pain through range of motion  Imaging: XRs of the lumbar spine from 07/30/2024 were independently reviewed and interpreted, showing disc height loss and anterior osteophyte formation at L2/3.  Greater disc height loss anteriorly in the disc base than posteriorly.  Laminectomy defect seen at L2/3.  There is a coronal curvature of the lumbar spine centered at the L2/3 disc space.  Apex of the curvature to the right.  No fracture or dislocation seen.  MRI of the lumbar spine (on CD) from 07/04/2024 was independently reviewed and interpreted, showing significant DDD at L2/3 with Modic changes. There is left sided foraminal stenosis as well. No other significant DDD or stenosis seen.    Patient name: Randy [name] Patient MRN: [id number] Date of visit: 07/30/24

## 2024-09-15 ENCOUNTER — Telehealth: Payer: Self-pay | Admitting: Orthopedic Surgery

## 2024-09-15 NOTE — Telephone Encounter (Signed)
 I called the patient and advised him his surgery for 09/22/24 had to be canceled due to his A1C being 11.2. Once A1C is down to 7.5 or lower he can be rescheduled for surgery. I advised patient to work with his PCP to get this down. Once in range patient is to call the office to be rescheduled for surgery.

## 2024-09-16 ENCOUNTER — Inpatient Hospital Stay (HOSPITAL_COMMUNITY): Admission: RE | Admit: 2024-09-16

## 2024-09-22 ENCOUNTER — Inpatient Hospital Stay (HOSPITAL_COMMUNITY): Admit: 2024-09-22 | Admitting: Orthopedic Surgery

## 2024-09-22 DIAGNOSIS — Z01818 Encounter for other preprocedural examination: Secondary | ICD-10-CM

## 2024-09-22 SURGERY — ANTERIOR LATERAL LUMBAR FUSION 1 LEVEL
Anesthesia: General

## 2024-10-07 ENCOUNTER — Encounter: Admitting: Orthopedic Surgery
